# Patient Record
Sex: Male | Born: 1960
Health system: Southern US, Community
[De-identification: ages and names within clinical notes are randomized; demographics above are authoritative.]

## PROBLEM LIST (undated history)

## (undated) ENCOUNTER — Emergency Department: Admission: RE | Disposition: A | Discharge status: Home / Self Care

## (undated) DIAGNOSIS — I2510 Atherosclerotic heart disease of native coronary artery without angina pectoris: Secondary | ICD-10-CM

## (undated) DIAGNOSIS — E785 Hyperlipidemia, unspecified: Secondary | ICD-10-CM

## (undated) DIAGNOSIS — R718 Other abnormality of red blood cells: Secondary | ICD-10-CM

## (undated) DIAGNOSIS — I214 Non-ST elevation (NSTEMI) myocardial infarction: Secondary | ICD-10-CM

## (undated) DIAGNOSIS — D560 Alpha thalassemia: Secondary | ICD-10-CM

## (undated) DIAGNOSIS — K4090 Unilateral inguinal hernia, without obstruction or gangrene, not specified as recurrent: Secondary | ICD-10-CM

## (undated) DIAGNOSIS — T7840XA Allergy, unspecified, initial encounter: Secondary | ICD-10-CM

## (undated) DIAGNOSIS — K219 Gastro-esophageal reflux disease without esophagitis: Secondary | ICD-10-CM

## (undated) DIAGNOSIS — F419 Anxiety disorder, unspecified: Secondary | ICD-10-CM

## (undated) HISTORY — PX: KNEE ARTHROSCOPY: SUR90

## (undated) HISTORY — DX: Atherosclerotic heart disease of native coronary artery without angina pectoris: I25.10

## (undated) HISTORY — PX: OTHER SURGICAL HISTORY: SHX169

## (undated) HISTORY — DX: Hyperlipidemia, unspecified: E78.5

## (undated) HISTORY — DX: Non-ST elevation (NSTEMI) myocardial infarction: I21.4

## (undated) HISTORY — DX: Anxiety disorder, unspecified: F41.9

## (undated) HISTORY — DX: Allergy, unspecified, initial encounter: T78.40XA

## (undated) HISTORY — DX: Gastro-esophageal reflux disease without esophagitis: K21.9

---

## 2006-05-28 ENCOUNTER — Ambulatory Visit: Payer: Self-pay | Admitting: Gastroenterology

## 2008-06-20 DIAGNOSIS — I214 Non-ST elevation (NSTEMI) myocardial infarction: Secondary | ICD-10-CM | POA: Insufficient documentation

## 2008-06-20 HISTORY — DX: Non-ST elevation (NSTEMI) myocardial infarction: I21.4

## 2008-06-27 ENCOUNTER — Inpatient Hospital Stay (HOSPITAL_COMMUNITY): Admission: EM | Admit: 2008-06-27 | Discharge: 2008-06-29 | Payer: Self-pay | Admitting: Emergency Medicine

## 2008-06-28 HISTORY — PX: CORONARY STENT PLACEMENT: SHX1402

## 2008-06-29 ENCOUNTER — Other Ambulatory Visit: Payer: Self-pay | Admitting: *Deleted

## 2008-07-07 ENCOUNTER — Encounter (HOSPITAL_COMMUNITY): Admission: RE | Admit: 2008-07-07 | Discharge: 2008-10-05 | Payer: Self-pay | Admitting: *Deleted

## 2008-10-06 ENCOUNTER — Encounter (HOSPITAL_COMMUNITY): Admission: RE | Admit: 2008-10-06 | Discharge: 2008-12-21 | Payer: Self-pay | Admitting: *Deleted

## 2009-05-08 DIAGNOSIS — E785 Hyperlipidemia, unspecified: Secondary | ICD-10-CM | POA: Insufficient documentation

## 2009-05-08 DIAGNOSIS — K219 Gastro-esophageal reflux disease without esophagitis: Secondary | ICD-10-CM | POA: Insufficient documentation

## 2009-05-15 DIAGNOSIS — Z9861 Coronary angioplasty status: Secondary | ICD-10-CM | POA: Insufficient documentation

## 2009-05-15 DIAGNOSIS — R7301 Impaired fasting glucose: Secondary | ICD-10-CM | POA: Insufficient documentation

## 2009-05-20 DIAGNOSIS — I2510 Atherosclerotic heart disease of native coronary artery without angina pectoris: Secondary | ICD-10-CM

## 2009-05-20 HISTORY — DX: Atherosclerotic heart disease of native coronary artery without angina pectoris: I25.10

## 2009-08-21 DIAGNOSIS — J309 Allergic rhinitis, unspecified: Secondary | ICD-10-CM | POA: Insufficient documentation

## 2009-10-24 HISTORY — PX: CARDIOVASCULAR STRESS TEST: SHX262

## 2010-05-01 ENCOUNTER — Ambulatory Visit: Payer: Self-pay | Admitting: Cardiology

## 2010-05-31 ENCOUNTER — Ambulatory Visit: Payer: Self-pay | Admitting: Cardiology

## 2010-06-28 IMAGING — CR DG CHEST 2V
2 series · 2 of 2 positions shown · non-contrast
Comparison: None

CLINICAL DATA: Left chest pain with left arm numbness.

CHEST - 2 VIEW

[w chest pa]
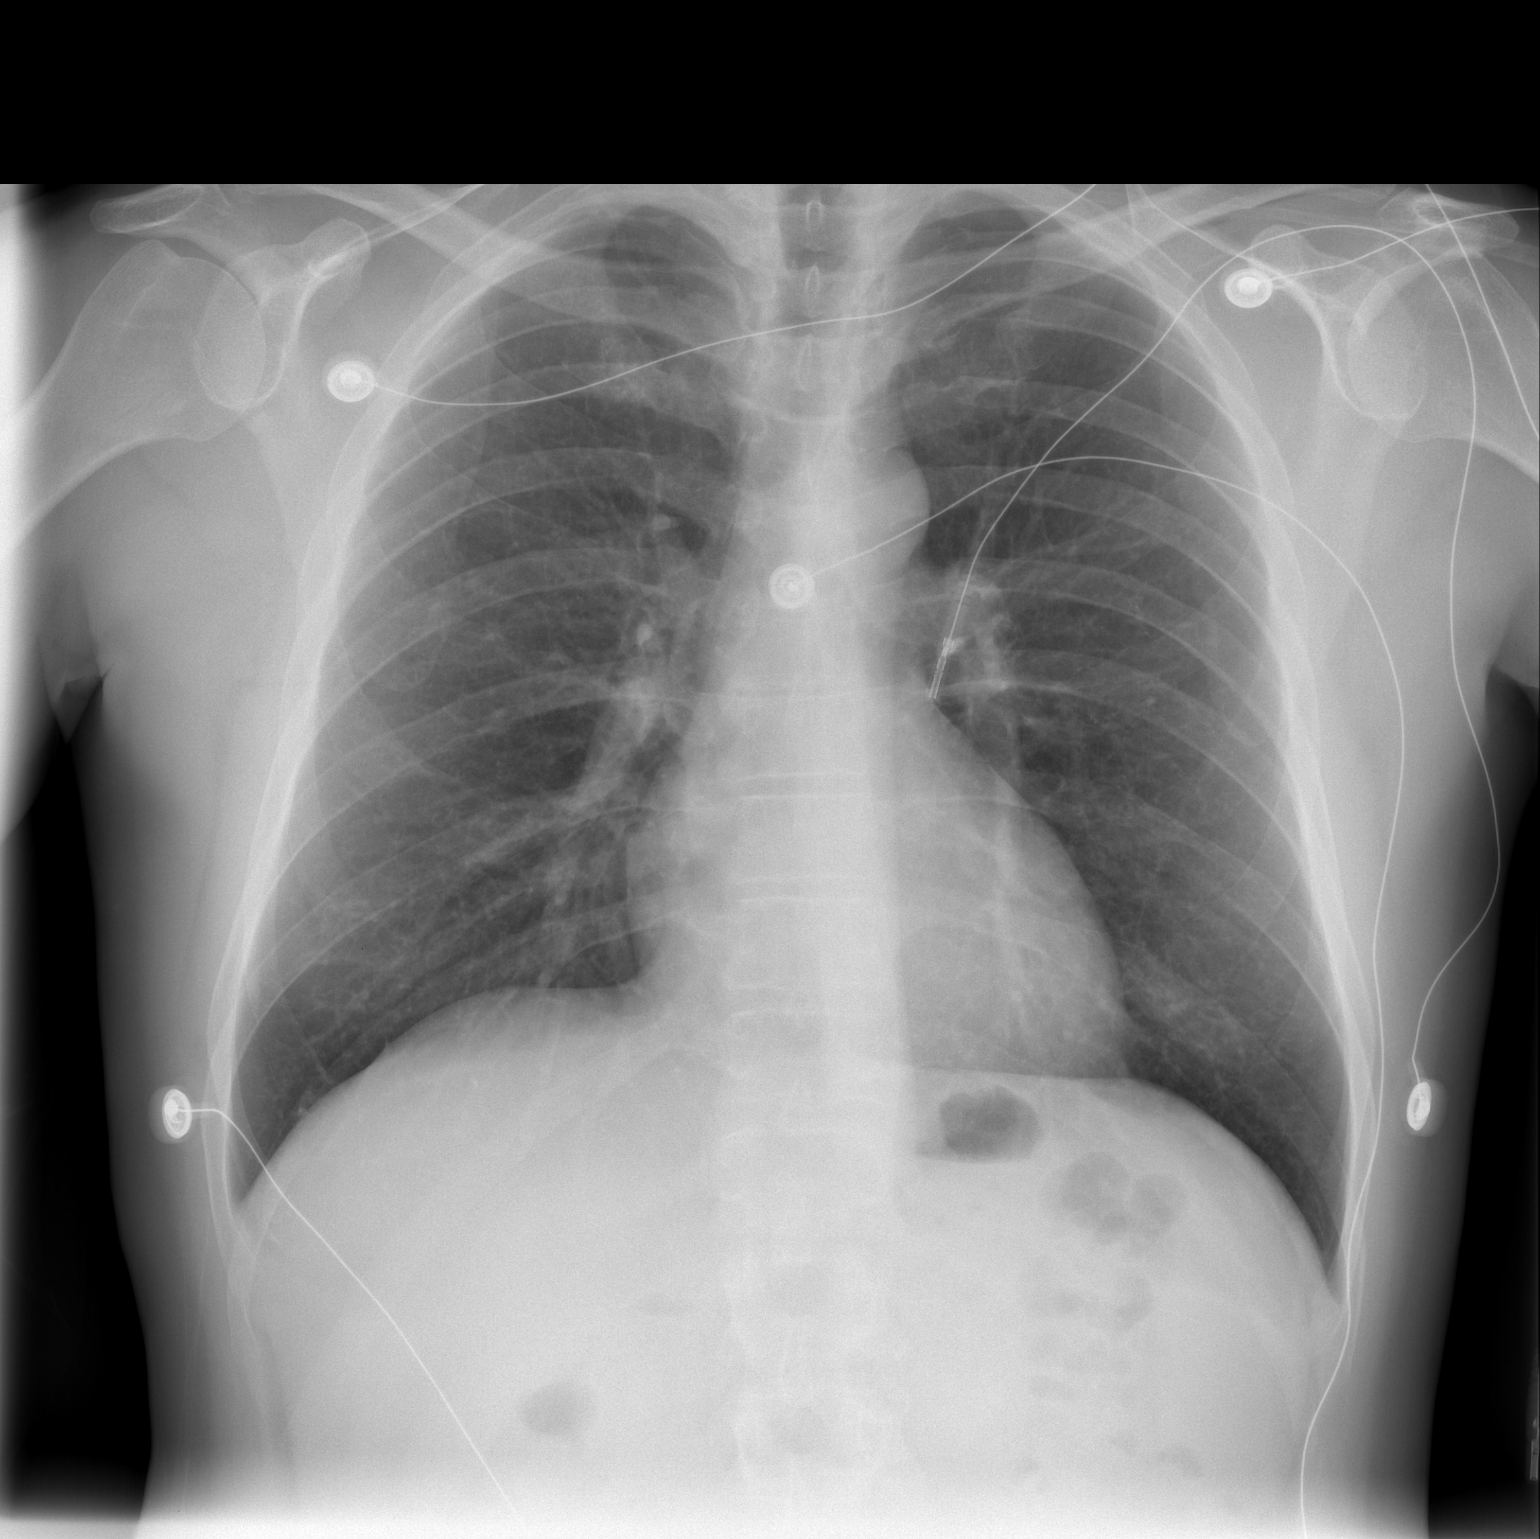

[w chest lat]
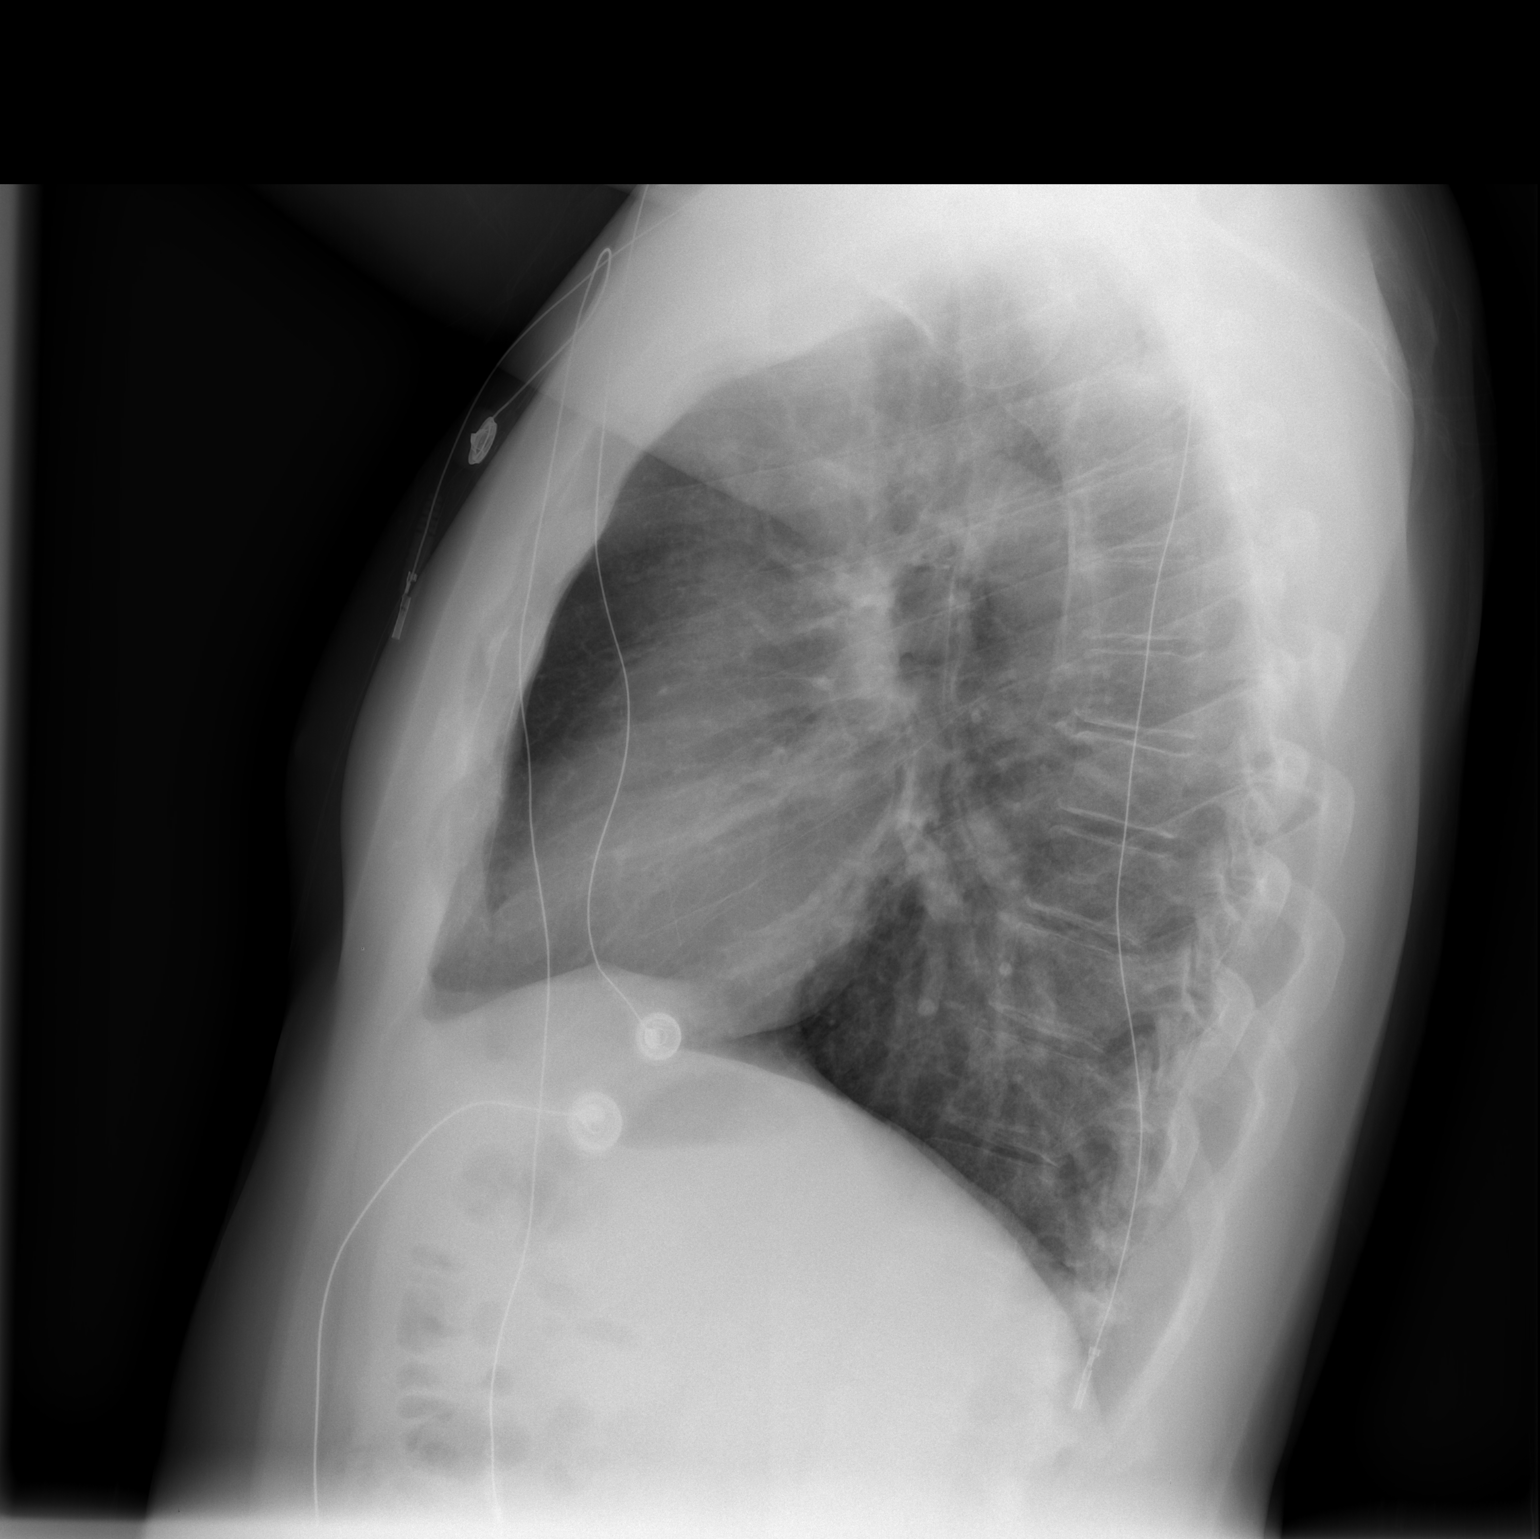

[2 of 2 positions shown; findings below may reference images not displayed]

FINDINGS: The heart size and mediastinal contours are normal.  The
lungs are clear.  There is no pleural effusion or pneumothorax.  No
acute osseous findings are demonstrated.
IMPRESSION: No active cardiopulmonary process.

REF:W2 DICTATED: 06/27/2008 [DATE]

## 2010-09-04 LAB — BASIC METABOLIC PANEL
BUN: 8 mg/dL (ref 6–23)
BUN: 8 mg/dL (ref 6–23)
CO2: 27 mEq/L (ref 19–32)
CO2: 28 mEq/L (ref 19–32)
CO2: 29 mEq/L (ref 19–32)
Calcium: 8.8 mg/dL (ref 8.4–10.5)
Calcium: 8.9 mg/dL (ref 8.4–10.5)
Chloride: 101 mEq/L (ref 96–112)
Chloride: 107 mEq/L (ref 96–112)
Chloride: 108 mEq/L (ref 96–112)
Creatinine, Ser: 0.69 mg/dL (ref 0.4–1.5)
Creatinine, Ser: 0.7 mg/dL (ref 0.4–1.5)
GFR calc Af Amer: >60 mL/min (ref >60)
GFR calc Af Amer: >60 mL/min (ref >60)
GFR calc Af Amer: >60 mL/min (ref >60)
GFR calc non Af Amer: >60 mL/min (ref >60)
GFR calc non Af Amer: >60 mL/min (ref >60)
Glucose, Bld: 107 mg/dL — ABNORMAL HIGH (ref 70–99)
Glucose, Bld: 116 mg/dL — ABNORMAL HIGH (ref 70–99)
Potassium: 3.5 mEq/L (ref 3.5–5.1)
Potassium: 3.5 mEq/L (ref 3.5–5.1)
Potassium: 3.8 mEq/L (ref 3.5–5.1)
Sodium: 135 mEq/L (ref 135–145)
Sodium: 140 mEq/L (ref 135–145)
Sodium: 142 mEq/L (ref 135–145)

## 2010-09-04 LAB — COMPREHENSIVE METABOLIC PANEL
ALT: 18 U/L (ref 0–53)
Albumin: 3.9 g/dL (ref 3.5–5.2)
Alkaline Phosphatase: 48 U/L (ref 39–117)
Potassium: 3.4 mEq/L — ABNORMAL LOW (ref 3.5–5.1)
Sodium: 140 mEq/L (ref 135–145)
Total Protein: 6.9 g/dL (ref 6.0–8.3)

## 2010-09-04 LAB — CBC
HCT: 38.3 % — ABNORMAL LOW (ref 39.0–52.0)
Hemoglobin: 11.7 g/dL — ABNORMAL LOW (ref 13.0–17.0)
Hemoglobin: 12.5 g/dL — ABNORMAL LOW (ref 13.0–17.0)
Hemoglobin: 12.6 g/dL — ABNORMAL LOW (ref 13.0–17.0)
MCHC: 32.6 g/dL (ref 30.0–36.0)
MCV: 68.4 fL — ABNORMAL LOW (ref 78.0–100.0)
Platelets: 155 10*3/uL (ref 150–400)
RBC: 5.29 MIL/uL (ref 4.22–5.81)
RBC: 5.6 MIL/uL (ref 4.22–5.81)
RBC: 5.82 MIL/uL — ABNORMAL HIGH (ref 4.22–5.81)
RDW: 13.7 % (ref 11.5–15.5)
RDW: 13.8 % (ref 11.5–15.5)
WBC: 6.7 10*3/uL (ref 4.0–10.5)

## 2010-09-04 LAB — URINALYSIS, ROUTINE W REFLEX MICROSCOPIC
Bilirubin Urine: NEGATIVE
Hgb urine dipstick: NEGATIVE
Ketones, ur: NEGATIVE mg/dL
Protein, ur: NEGATIVE mg/dL
Urobilinogen, UA: 0.2 mg/dL (ref 0.0–1.0)

## 2010-09-04 LAB — POCT I-STAT, CHEM 8
BUN: 16 mg/dL (ref 6–23)
Calcium, Ion: 1.17 mmol/L (ref 1.12–1.32)
Chloride: 101 mEq/L (ref 96–112)
Creatinine, Ser: 0.9 mg/dL (ref 0.4–1.5)
Glucose, Bld: 106 mg/dL — ABNORMAL HIGH (ref 70–99)
HCT: 43 % (ref 39.0–52.0)
Hemoglobin: 14.6 g/dL (ref 13.0–17.0)
Potassium: 3.5 mEq/L (ref 3.5–5.1)
Sodium: 142 mEq/L (ref 135–145)
TCO2: 30 mmol/L (ref 0–100)

## 2010-09-04 LAB — CARDIAC PANEL(CRET KIN+CKTOT+MB+TROPI)
CK, MB: 11.2 ng/mL — ABNORMAL HIGH (ref 0.3–4.0)
Relative Index: 8.8 — ABNORMAL HIGH (ref 0.0–2.5)
Total CK: 128 U/L (ref 7–232)
Troponin I: 1.69 ng/mL (ref 0.00–0.06)

## 2010-09-04 LAB — DIFFERENTIAL
Basophils Absolute: 0 10*3/uL (ref 0.0–0.1)
Basophils Relative: 0 % (ref 0–1)
Eosinophils Absolute: 0.1 10*3/uL (ref 0.0–0.7)
Eosinophils Relative: 2 % (ref 0–5)
Lymphocytes Relative: 29 % (ref 12–46)
Lymphs Abs: 1.5 10*3/uL (ref 0.7–4.0)
Monocytes Absolute: 0.4 10*3/uL (ref 0.1–1.0)
Monocytes Relative: 7 % (ref 3–12)
Neutro Abs: 3.3 10*3/uL (ref 1.7–7.7)
Neutrophils Relative %: 62 % (ref 43–77)

## 2010-09-04 LAB — LIPID PANEL
Cholesterol: 167 mg/dL (ref 0–200)
LDL Cholesterol: 124 mg/dL — ABNORMAL HIGH (ref 0–99)
Total CHOL/HDL Ratio: 7 RATIO
Triglycerides: 95 mg/dL (ref <150)
VLDL: 19 mg/dL (ref 0–40)

## 2010-09-04 LAB — POCT CARDIAC MARKERS
CKMB, poc: 1.1 ng/mL (ref 1.0–8.0)
Myoglobin, poc: 98.8 ng/mL (ref 12–200)
Troponin i, poc: 0.22 ng/mL — ABNORMAL HIGH (ref 0.00–0.09)

## 2010-09-04 LAB — CK TOTAL AND CKMB (NOT AT ARMC)
CK, MB: 2.2 ng/mL (ref 0.3–4.0)
Relative Index: 2.2 (ref 0.0–2.5)

## 2010-09-04 LAB — PROTIME-INR
INR: 1 (ref 0.00–1.49)
Prothrombin Time: 13.7 seconds (ref 11.6–15.2)

## 2010-09-04 LAB — TROPONIN I: Troponin I: 1.23 ng/mL (ref 0.00–0.06)

## 2010-09-04 LAB — APTT: aPTT: 28 seconds (ref 24–37)

## 2010-09-04 LAB — GLUCOSE, CAPILLARY: Glucose-Capillary: 105 mg/dL — ABNORMAL HIGH (ref 70–99)

## 2010-10-02 NOTE — H&P (Signed)
NAMEABOUBACAR, [name]             ACCOUNT NO.:  [id number]   MEDICAL RECORD NO.:  [id number]          PATIENT TYPE:  INP   LOCATION:  0110                         FACILITY:  [facility]   PHYSICIAN:  Elmore Guise., M.D.DATE OF BIRTH:  [birth year]   DATE OF ADMISSION:  06/27/2008  DATE OF DISCHARGE:                              HISTORY & PHYSICAL   PRIMARY CARE PHYSICIAN:  Dr. Eric Form   REASON FOR ADMISSION:  Acute coronary syndrome.   HISTORY OF PRESENT ILLNESS:  [name] is a very pleasant 50 year old  male with past medical history of gastroesophageal reflux disease who  presents for evaluation of chest pain.  The patient reports his symptoms  worsening since last Friday.  Prior to that time, he would have off-and-  on symptoms.  He actually underwent exercise stress testing back in  December 2009 which was normal.  After his stress test and in January  2010, he had no problems.  However, since last Friday, he has been  having off-and-on symptoms of substernal chest pressure.  He states his  symptoms are worse with exertion and are associated with nausea,  diaphoresis.  He had a spell on Friday that lasted approximately an  hour.  However, once it resolved, he was then able to get up and do his  normal activities.  He stated that he also noted some increased  belching at that time.  He did not think much of it.  On Saturday and  Sunday, he was able to go and do his normal activities without any  problems.  Early this morning, he started having an episode where he  described severe substernal chest pressure with radiation to both arms  associated with diaphoresis and nausea.  This did not get much better  with rest.  Because of his symptoms, came to the emergency room for  evaluation.  Here, his ECG was normal.  However, his initial troponin  was elevated.  He currently remains chest pain free and will be admitted  for evaluation and treatment.  He denies any recent fever or  cough.  No  orthopnea or PND.  No melena.   REVIEW OF SYSTEMS:  As per HPI.  All others are negative.   CURRENT MEDICATION:  Nexium.   ALLERGIES:  None.   FAMILY HISTORY:  Positive for heart disease and stroke, father having  bypass in his 27s, mother having a heart attack in her 48s.   SOCIAL HISTORY:  He is married.  No tobacco or alcohol use.   PHYSICAL EXAMINATION:  VITAL SIGNS:  He is afebrile.  Blood pressure is  100/60, heart rate 80, saturation 95% on room air.  IN GENERAL:  He is a very pleasant middle-aged male alert and oriented  x4 in no acute distress.  HEENT:  Unremarkable.  NECK:  Supple.  No lymphadenopathy, 2+ carotids.  No JVD and no bruits.  Thyroid is normal.  LUNGS:  Clear.  HEART:  Regular with normal S1, S2.  No rub noted.  ABDOMEN:  Soft, nontender, nondistended.  No rebound or guarding.  No  abdominal bruits  noted.  EXTREMITIES:  Warm with 2+ pulses and no edema.  Femorals are 2+ and  equal bilaterally.  NEUROLOGICALLY:  He has no focal deficits.   LABORATORY DATA:  His blood work shows a hemoglobin of 12.6.  White  blood cell count 5.3, platelet count 156.  Coags are normal with a  PT/INR of 13.7 and 1 respectively and PTT of 28.  BUN and creatinine of  14 and 0.8 respectively with potassium level of 3.4.  Total bilirubin  was 1.6 with normal alkaline phosphatase, AST and ALT.  CPK was 101 and  107 with an MB of 2.2 and then 7.3.  Troponin-I was 0.42 and 1.23.  His  chest x-ray shows no acute cardiopulmonary disease.  ECG shows normal  sinus rhythm at 80 per minute with no significant ST or T-wave changes.   IMPRESSION:  1. Acute coronary syndrome.  2. History of gastroesophageal reflux disease.   PLAN:  1. The patient will be admitted to telemetry monitoring.  We will      start him on nitroglycerin drip.  Continue Lovenox 1 mg/kg      subcutaneously twice daily.  He will be started on metoprolol 25 mg      twice daily as well as Crestor 20 mg  daily.  At this time, I will      hold Plavix until we can finish his heart catheterization.  He will      be scheduled for heart catheterization in the morning.  He will      have serial cardiac markers done as well as serial ECGs as needed.      All this was explained with him at length.      Elmore Guise., M.D.  Electronically Signed     TWK/MEDQ  D:  06/27/2008  T:  06/27/2008  Job:  [id number]

## 2010-10-02 NOTE — Cardiovascular Report (Signed)
NAMEMARCELL, CHAVARIN NO.:  [id number]   MEDICAL RECORD NO.:  [id number]          PATIENT TYPE:  INP   LOCATION:  2502                         FACILITY:  [facility]   PHYSICIAN:  Peter M. Swaziland, M.D.  DATE OF BIRTH:  09/16/1960   DATE OF PROCEDURE:  DATE OF DISCHARGE:                            CARDIAC CATHETERIZATION   INDICATIONS FOR PROCEDURE:  A 50 year old male who presented with a non-  Q-wave myocardial infarction.  He has a history of hypercholesterolemia.  Diagnostic cardiac catheterization performed by Dr. Reyes Ivan demonstrated  a 90% proximal LAD stenosis with thrombus, which was the culprit lesion.  The left circumflex coronary artery had 50% disease, and the right  coronary artery had 70% disease in the proximal to mid vessel.  We  recommended intervention on the culprit lesion.  The patient did have a  normal stress test, well over a month ago, and therefore, we will not  intervene on the right coronary at this time unless he were to have  recurrent angina.   The access was via the right femoral artery using a 6-French arterial  sheath.   EQUIPMENTS:  1. A 6-French FL4 guide.  2. A 0.014 Prowater wire.  3. A Fetch extraction catheter.  4. A 2.5 x 12 mm Apex balloon.  5. A 3.0 x 15 mm XIENCE stent.  6. A 3.25 x 12 mm [place] Voyager balloon.   MEDICATIONS:  1. Nitroglycerin 200 mcg intracoronary x1.  2. Angiomax bolus at 0.75 mg/kg followed by continuous infusion at      1.75 mg/kg/hour.  Subsequent ACT was 286 second.  3. Effient 60 mg p.o.   The lesion in the proximal LAD was crossed easily with the guidewire.  We initially performed a couple of passes with the Fetch extraction  catheter and were able to obtain a moderate amount of white thrombus.  Angiographically, the vessel was improved.  We next predilated the  lesion using a 2.5 x 12 mm Apex stent up to 6 atmospheres.  We then  deployed the 3.0 x 15 mm XIENCE stent at 9 and then 12 atmospheres  with  a stent balloon.  It was postdilated using a 3.25 x 12 mm [place] Voyager  balloon up to 14 atmospheres.  This yielded an excellent angiographic  result with 0% residual stenosis and TIMI grade 3 flow.  There was no  residual thrombus noted.  The patient was pain free and hemodynamically  stable.   FINAL INTERPRETATION:  Successful intracoronary stenting of the proximal  left anterior descending with thrombus extraction.           ______________________________  Peter M. [place], M.D.     PMJ/MEDQ  D:  06/28/2008  T:  06/28/2008  Job:  [id number]   cc:   Elmore Guise., M.D.  Kari Baars, M.D.

## 2010-10-02 NOTE — Discharge Summary (Signed)
NAMEJOSIYAH, Bob Adams             ACCOUNT NO.:  [id number]   MEDICAL RECORD NO.:  [id number]          PATIENT TYPE:  INP   LOCATION:  2502                         FACILITY:  [facility]   PHYSICIAN:  Elmore Guise., M.D.DATE OF BIRTH:  [birth year]   DATE OF ADMISSION:  06/28/2008  DATE OF DISCHARGE:  06/29/2008                               DISCHARGE SUMMARY   DISCHARGE DIAGNOSES:  1. Non-ST-elevation myocardial infarction (peak troponin 1.69).  2. Coronary artery disease (status post percutaneous coronary      intervention and drug-eluting stent placement to proximal left      anterior descending).  3. Mild plaque formation in the mid circumflex (approximately 30-40%      stenosis).  4. Moderate disease in the mid right coronary artery (60-70%      stenosis).  5. History of gastroesophageal reflux disease.  6. Dyslipidemia.   HISTORY OF PRESENT ILLNESS:  [name] is a very pleasant 50 year old  white male who presented with acute coronary syndrome.  He was admitted  to the hospital for further treatment.   HOSPITAL COURSE:  The patient's hospital course was uncomplicated.  He  had a peak troponin of 1.69.  He underwent cardiac catheterization on  June 28, 2008.  He tolerated the procedure well.  His cath did show  90-95% stenosis of the proximal to/mid LAD with an ulcerated plaque and  thrombus formation noted.  He had 30-40% stenosis noted in the mid  circumflex vessel and RCA was dominant with mid 60-70% stenosis noted.  He underwent elective PCI of his LAD with excellent results.  Following  his procedure, he had no problems.  He has now been up and ambulatory  and tolerating a normal diet well.  He has had no further chest pain.  His lab work on discharge showed a BUN and creatinine of 8 and 0.7, a  potassium level of 3.5.  His hemoglobin was 12.5 with a platelet count  of 155.  His lipid profile had a total cholesterol of 167, triglycerides  of 95, HDL of 24 and LDL  of 124.  The patient will be discharged home  today to continue the following medications.  1. Effient 10 mg daily.  2. Aspirin 325 mg daily.  3. Nexium 40 mg daily.  4. Crestor 20 mg daily.  5. Toprol 25 mg daily.  6. Nitroglycerin on a p.r.n. basis.   He should resume post cath restrictions.  He is to slowly increase his  activity.  He was advised not to return to work until July 06, 2008.  He is not to do any heavy lifting or strenuous activity until that time.  I would like to see him back in the office in 1 week.  Should he have  any further anginal symptoms, he will be scheduled for stage procedure  of his mid RCA.  I do think at this time aggressive medical therapy is  recommended unless he becomes  refractory.  He needs to be treated with dual antiplatelet agents for at  least 1 year.  Effient was recommended because of his use of  Nexium in  the past.  Unless he has any further problems, I plan to see him in the  office in 1 week.      Elmore Guise., M.D.  Electronically Signed     TWK/MEDQ  D:  06/29/2008  T:  06/29/2008  Job:  [id number]   cc:   Kari Baars, M.D.

## 2010-10-02 NOTE — Cardiovascular Report (Signed)
NAMEJOVANIE, VERGE             ACCOUNT NO.:  [id number]   MEDICAL RECORD NO.:  [id number]          PATIENT TYPE:  INP   LOCATION:  2502                         FACILITY:  [facility]   PHYSICIAN:  Elmore Guise., M.D.DATE OF BIRTH:  Jul 02, 1960   DATE OF PROCEDURE:  06/28/2008  DATE OF DISCHARGE:                            CARDIAC CATHETERIZATION   INDICATIONS FOR PROCEDURE:  Acute coronary syndrome.   HISTORY OF PRESENT ILLNESS:  Mr. Broxson is a very pleasant 50 year old  male with past medical history of gastroesophageal reflux disease, who  presented with increasing chest pain and shortness of breath starting  this past Friday.  His ECG was normal.  However, his troponins were  abnormal.  He is now referred for cardiac catheterization.   DESCRIPTION OF PROCEDURE:  After appropriate informed consent, the  patient was prepped and draped in a sterile fashion.  Approximately 10  mL of 1% lidocaine was used for local anesthesia.  A 5-French sheath was  placed in the right femoral artery without difficulty.  Coronary  angiography, LV angiography were then performed.  Sheath was left in  place to continue on with elective intervention.   FINDINGS:  1. Left Main:  Normal.  2. LAD:  Moderate-sized vessel with prox to mid ulcerated plaque and      thrombus formation, 90% stenosis noted.  There is mild to moderate      mid and distal luminal irregularities noted.  3. D1/D2:  Small vessels with mild luminal irregularities.  4. d3:  Moderate-sized vessel with mild luminal irregularities.  5. LCX:  Nondominant with mid 30-40% stenosis, mild distal luminal      irregularities were noted.  6. OM-1/OM-2:  Mild luminal irregularities.  7. RCA:  Dominant with mid 70% stenosis, mild to moderate distal      luminal irregularities noted.  8. LV:  EF 65%.  No wall motion abnormalities.  LVEDP is 13 mmHg.   IMPRESSION:  1. Ulcerated plaque with thrombus formation and 90% stenosis in the  proximal to mid left anterior descending .  2. Mild left circumflex disease (30-40% mid stenosis).  3. Moderate right coronary artery disease with 70% mid stenosis.  4. Vigorous left ventricular systolic function, ejection fraction 65%.   PLAN:  At this time, I would recommend elective PCI of the LAD.  Would  continue aggressive antiplatelet therapies with aspirin, Effient, low-  dose beta-blocker as well as statin therapy.  If symptoms continue then  staged procedure of RCA will be scheduled.      Elmore Guise., M.D.  Electronically Signed     TWK/MEDQ  D:  06/28/2008  T:  06/28/2008  Job:  [id number]   cc:   Kari Baars, M.D.

## 2010-10-05 NOTE — Assessment & Plan Note (Signed)
[facility]                         GASTROENTEROLOGY OFFICE NOTE   MYKEL, SPONAUGLE                      MRN:          [id number]  DATE:05/28/2006                            DOB:          1961/05/17    REASON FOR CONSULTATION:  Reflux.   Bob Adams is a pleasant, 50 year old male referred through the  courtesy of Dr. Clelia Croft for evaluation.  For years, he has suffered from  reflux symptoms consisting of pyrosis.  He has been on various PPIs and  H-2 receptor antagonists with good control.  With dietary indiscretion,  he may have breakthrough pyrosis.  He currently takes Nexium which he  feels is the best medicine for him.  He denies dysphagia, cough, or sore  throat.  He occasionally has some very nonspecific upper abdominal  discomfort.  He has undergone upper endoscopy twice in the last 7 years  for this problem.  Hiatal hernia was diagnosed.   PAST MEDICAL HISTORY:  Unremarkable.   FAMILY HISTORY:  Noncontributory.   MEDICATIONS:  Include Nexium.   He has no allergies.   He neither smokes nor drinks.  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   REVIEW OF SYSTEMS:  Pertinent for insomnia.   EXAM:  He is a rather anxious male.  Pulse 72.  Blood pressure 120/70.  Weight 164.  HEENT:  EOMI. PERRLA. Sclerae are anicteric.  Conjunctivae are pink.  NECK:  Supple without thyromegaly, adenopathy or carotid bruits.  CHEST:  Clear to auscultation and percussion without adventitious  sounds.  CARDIAC:  Regular rhythm; normal S1 S2.  There are no murmurs, gallops  or rubs.  ABDOMEN:  Bowel sounds are normoactive.  Abdomen is soft, non-tender and  non-distended.  There are no abdominal masses, tenderness, splenic  enlargement or hepatomegaly.  EXTREMITIES:  Full range of motion.  No cyanosis, clubbing or edema.  RECTAL:  Deferred.   IMPRESSION:  1. Gastroesophageal reflux disease - well controlled with proton-pump      inhibitor therapy.  2. Anxiety.   This seems to be a chronic problem.  This was discussed      in some detail and I suggested that he may consider an anxiolytic.      He will pursue this with Dr. Clelia Croft.   RECOMMENDATION:  1. Continue Nexium.  2. Consider anxiolytic medicines.  3. Screening colonoscopy in approximately 4 years.     Barbette Hair. Arlyce Dice, MD,FACG  Electronically Signed    RDK/MedQ  DD: 05/28/2006  DT: 05/28/2006  Job #: [id number]   cc:   Kari Baars, M.D.

## 2010-10-22 ENCOUNTER — Other Ambulatory Visit: Payer: Self-pay | Admitting: Cardiology

## 2010-10-22 MED ORDER — ROSUVASTATIN CALCIUM 40 MG PO TABS: 40.0000 mg | ORAL_TABLET | Freq: Every day | ORAL | Status: DC

## 2010-10-22 MED ORDER — PRASUGREL HCL 10 MG PO TABS: 10.0000 mg | ORAL_TABLET | Freq: Every day | ORAL | Status: DC

## 2010-10-22 NOTE — Telephone Encounter (Signed)
escribe medication per fax request  

## 2010-10-22 NOTE — Telephone Encounter (Signed)
NEEDS EFFIN AND CRESTCOR CALLED INTO [facility]  [phone]

## 2010-11-16 ENCOUNTER — Encounter: Payer: Self-pay | Admitting: Cardiology

## 2010-11-26 ENCOUNTER — Encounter: Payer: Self-pay | Admitting: Cardiology

## 2010-11-26 ENCOUNTER — Ambulatory Visit (INDEPENDENT_AMBULATORY_CARE_PROVIDER_SITE_OTHER): Payer: 59 | Admitting: Cardiology

## 2010-11-26 DIAGNOSIS — I2510 Atherosclerotic heart disease of native coronary artery without angina pectoris: Secondary | ICD-10-CM

## 2010-11-26 DIAGNOSIS — I214 Non-ST elevation (NSTEMI) myocardial infarction: Secondary | ICD-10-CM

## 2010-11-26 DIAGNOSIS — E785 Hyperlipidemia, unspecified: Secondary | ICD-10-CM

## 2010-11-26 NOTE — Patient Instructions (Signed)
Keep up the good work with your diet and exercise.  I will see you back again in 6 months.

## 2010-11-27 DIAGNOSIS — I2510 Atherosclerotic heart disease of native coronary artery without angina pectoris: Secondary | ICD-10-CM | POA: Insufficient documentation

## 2010-11-27 DIAGNOSIS — E785 Hyperlipidemia, unspecified: Secondary | ICD-10-CM | POA: Insufficient documentation

## 2010-11-27 NOTE — Assessment & Plan Note (Signed)
His lipid panel from December of 2011 was reviewed and was excellent. Continue with his current therapy.

## 2010-11-27 NOTE — Progress Notes (Signed)
   Bob Adams Date of Birth: [birth year]   History of Present Illness: Bob Adams is seen today for followup. He reports that he has been doing very well. He denies any symptoms of chest pain, shoulder pain, or shortness of breath. He remains very active. He is watching his diet closely and has lost 7 pounds.  Current Outpatient Prescriptions on File Prior to Visit  Medication Sig Dispense Refill  . aspirin 81 MG tablet Take 81 mg by mouth daily.        . nitroGLYCERIN (NITROSTAT) 0.4 MG SL tablet Place 0.4 mg under the tongue every 5 (five) minutes as needed.        . prasugrel (EFFIENT) 10 MG TABS Take 1 tablet (10 mg total) by mouth daily.  90 tablet  3  . rosuvastatin (CRESTOR) 40 MG tablet Take 1 tablet (40 mg total) by mouth daily.  90 tablet  3    No Known Allergies  Past Medical History  Diagnosis Date  . Coronary artery disease   . Non Q wave myocardial infarction 06/2008  . Dyslipidemia   . GERD (gastroesophageal reflux disease)     Past Surgical History  Procedure Date  . Coronary stent placement 06/28/2008    SUCCESSFUL INTRACORONARY STENTING OF THE PROXIMAL LEFT ANTERIOR DESCENDING WITH THROMBUS EXTRACTION. EF 65%  . Cardiovascular stress test 10/24/2009    EF 60%    History  Smoking status  . Never Smoker   Smokeless tobacco  . Not on file    History  Alcohol Use No    Family History  Problem Relation Age of Onset  . Heart attack Mother     Review of Systems:  All other systems were reviewed and are negative.  Physical Exam: BP 120/70  Pulse 80  Ht 5\' 7"  (1.702 m)  Wt 155 lb (70.308 kg)  BMI 24.28 kg/m2 He is a thin male in no acute distress. His HEENT exam is unremarkable. He has no JVD or bruits. Lungs are clear. Cardiac exam reveals a regular rate and rhythm without gallop, murmur, or click. Abdomen is soft and nontender. He has no masses or bruits. He has no edema. Pedal pulses are good. He is alert and oriented x3. Cranial nerves II through  XII are intact. LABORATORY DATA:   Assessment / Plan:

## 2010-11-27 NOTE — Assessment & Plan Note (Signed)
He is asymptomatic. He had a normal stress echo evaluation in June 2011. We will continue with aspirin and Effient and  statin therapy. I will followup again in 6 months.

## 2011-05-20 ENCOUNTER — Encounter: Payer: Self-pay | Admitting: Cardiology

## 2011-06-04 ENCOUNTER — Ambulatory Visit (INDEPENDENT_AMBULATORY_CARE_PROVIDER_SITE_OTHER): Payer: 59 | Admitting: Cardiology

## 2011-06-04 ENCOUNTER — Encounter: Payer: Self-pay | Admitting: Cardiology

## 2011-06-04 VITALS — BP 110/70 | HR 76 | Ht 66.0 in | Wt 162.6 lb

## 2011-06-04 DIAGNOSIS — E785 Hyperlipidemia, unspecified: Secondary | ICD-10-CM

## 2011-06-04 DIAGNOSIS — I2510 Atherosclerotic heart disease of native coronary artery without angina pectoris: Secondary | ICD-10-CM

## 2011-06-04 NOTE — Patient Instructions (Signed)
Continue your current medications and exercise.  I will see you again in 6 months.   

## 2011-06-04 NOTE — Assessment & Plan Note (Signed)
Excellent lipid control on current medications.

## 2011-06-04 NOTE — Assessment & Plan Note (Signed)
He remains asymptomatic. He is on appropriate risk factor modification. Will plan a followup again in 6 months. We will consider a stress test at that time since his last evaluation was in June of 2011.

## 2011-06-04 NOTE — Progress Notes (Signed)
   Bob Adams Date of Birth: Apr 09, 1961   History of Present Illness: Bob Adams is seen today for followup. He reports that he has been doing very well. He denies any symptoms of chest pain, shoulder pain, or shortness of breath. He remains very active. He did make a visit to [place] over the holidays and had no difficulty with his extended travel.  Current Outpatient Prescriptions on File Prior to Visit  Medication Sig Dispense Refill  . aspirin 81 MG tablet Take 81 mg by mouth daily.        . nitroGLYCERIN (NITROSTAT) 0.4 MG SL tablet Place 0.4 mg under the tongue every 5 (five) minutes as needed.        . prasugrel (EFFIENT) 10 MG TABS Take 1 tablet (10 mg total) by mouth daily.  90 tablet  3  . rosuvastatin (CRESTOR) 40 MG tablet Take 1 tablet (40 mg total) by mouth daily.  90 tablet  3    No Known Allergies  Past Medical History  Diagnosis Date  . Coronary artery disease   . Non Q wave myocardial infarction 06/2008  . Dyslipidemia   . GERD (gastroesophageal reflux disease)     Past Surgical History  Procedure Date  . Coronary stent placement 06/28/2008    SUCCESSFUL INTRACORONARY STENTING OF THE PROXIMAL LEFT ANTERIOR DESCENDING WITH THROMBUS EXTRACTION. EF 65%  . Cardiovascular stress test 10/24/2009    EF 60%    History  Smoking status  . Never Smoker   Smokeless tobacco  . Not on file    History  Alcohol Use No    Family History  Problem Relation Age of Onset  . Heart attack Mother     Review of Systems:  All other systems were reviewed and are negative.  Physical Exam: BP 110/70  Pulse 76  Ht 5\' 6"  (1.676 m)  Wt 162 lb 9.6 oz (73.755 kg)  BMI 26.24 kg/m2 He is a thin male in no acute distress. His HEENT exam is unremarkable. He has no JVD or bruits. Lungs are clear. Cardiac exam reveals a regular rate and rhythm without gallop, murmur, or click. Abdomen is soft and nontender. He has no masses or bruits. He has no edema. Pedal pulses are good. He is alert  and oriented x3. Cranial nerves II through XII are intact. LABORATORY DATA: Dated 05/20/2011 his chemistry panel was normal with the exception of total bilirubin of 3.1. CBC showed mild anemia with a hemoglobin 11.7. Total cholesterol 118, triglycerides 68, HDL 42, LDL 62. Urinalysis was negative. Further anemia workup including LDH, haptoglobin, and iron studies were normal. ECG today is normal.  Assessment / Plan:

## 2011-09-03 DIAGNOSIS — J45909 Unspecified asthma, uncomplicated: Secondary | ICD-10-CM | POA: Insufficient documentation

## 2011-11-11 ENCOUNTER — Other Ambulatory Visit: Payer: Self-pay | Admitting: Cardiology

## 2011-11-11 NOTE — Telephone Encounter (Signed)
New msg Pt wants refill effient and crestor Please call to [facility] He wants 90 days supply [phone]

## 2011-11-13 MED ORDER — PRASUGREL HCL 10 MG PO TABS: 10.0000 mg | ORAL_TABLET | Freq: Every day | ORAL | Status: DC

## 2011-11-13 MED ORDER — ROSUVASTATIN CALCIUM 40 MG PO TABS: 40.0000 mg | ORAL_TABLET | Freq: Every day | ORAL | Status: DC

## 2011-11-13 NOTE — Telephone Encounter (Signed)
F/u   Patient calling to f/u as refill is not available through [facility].

## 2011-11-14 ENCOUNTER — Telehealth: Payer: Self-pay

## 2011-11-14 MED ORDER — PRASUGREL HCL 10 MG PO TABS: 10.0000 mg | ORAL_TABLET | Freq: Every day | ORAL | Status: DC

## 2011-11-14 MED ORDER — ROSUVASTATIN CALCIUM 40 MG PO TABS: 40.0000 mg | ORAL_TABLET | Freq: Every day | ORAL | Status: DC

## 2011-11-14 NOTE — Telephone Encounter (Signed)
Patient's wife called was told refills on effient and crestor sent to [facility].

## 2012-02-20 ENCOUNTER — Encounter: Payer: Self-pay | Admitting: *Deleted

## 2012-02-20 ENCOUNTER — Encounter: Payer: Self-pay | Admitting: Cardiology

## 2012-04-29 ENCOUNTER — Ambulatory Visit (INDEPENDENT_AMBULATORY_CARE_PROVIDER_SITE_OTHER): Payer: 59 | Admitting: Cardiology

## 2012-04-29 ENCOUNTER — Encounter: Payer: Self-pay | Admitting: Cardiology

## 2012-04-29 VITALS — BP 129/72 | HR 77 | Ht 66.0 in | Wt 162.1 lb

## 2012-04-29 DIAGNOSIS — I2510 Atherosclerotic heart disease of native coronary artery without angina pectoris: Secondary | ICD-10-CM

## 2012-04-29 DIAGNOSIS — E785 Hyperlipidemia, unspecified: Secondary | ICD-10-CM

## 2012-04-29 DIAGNOSIS — I214 Non-ST elevation (NSTEMI) myocardial infarction: Secondary | ICD-10-CM

## 2012-04-29 NOTE — Progress Notes (Signed)
   Claud Kelp Date of Birth: 01/06/1961   History of Present Illness: Bob Adams is seen today for followup. He reports that he has been doing very well. He denies any symptoms of chest pain, shoulder pain, or shortness of breath. He remains very active. He is concerned about bruising on Effient since he enjoys playing soccer in an adult league.  Current Outpatient Prescriptions on File Prior to Visit  Medication Sig Dispense Refill  . aspirin 81 MG tablet Take 81 mg by mouth daily.        . fluticasone (FLONASE) 50 MCG/ACT nasal spray Place 1 spray into the nose as needed.       . nitroGLYCERIN (NITROSTAT) 0.4 MG SL tablet Place 0.4 mg under the tongue every 5 (five) minutes as needed.        . prasugrel (EFFIENT) 10 MG TABS Take 1 tablet (10 mg total) by mouth daily.  90 tablet  3  . rosuvastatin (CRESTOR) 40 MG tablet Take 1 tablet (40 mg total) by mouth daily.  90 tablet  3    No Known Allergies  Past Medical History  Diagnosis Date  . Coronary artery disease   . Non Q wave myocardial infarction 06/2008  . Dyslipidemia   . GERD (gastroesophageal reflux disease)     Past Surgical History  Procedure Date  . Coronary stent placement 06/28/2008    SUCCESSFUL INTRACORONARY STENTING OF THE PROXIMAL LEFT ANTERIOR DESCENDING WITH THROMBUS EXTRACTION. EF 65%  . Cardiovascular stress test 10/24/2009    EF 60%    History  Smoking status  . Never Smoker   Smokeless tobacco  . Not on file    History  Alcohol Use No    Family History  Problem Relation Age of Onset  . Heart attack Mother     Review of Systems:  All other systems were reviewed and are negative.  Physical Exam: BP 129/72  Pulse 77  Ht 5\' 6"  (1.676 m)  Wt 162 lb 1.9 oz (73.537 kg)  BMI 26.17 kg/m2 He is a thin male in no acute distress. His HEENT exam is unremarkable. He has no JVD or bruits. Lungs are clear. Cardiac exam reveals a regular rate and rhythm without gallop, murmur, or click. Abdomen is soft  and nontender. He has no masses or bruits. He has no edema. Pedal pulses are good. He is alert and oriented x3. Cranial nerves II through XII are intact.  LABORATORY DATA:   Assessment / Plan: 1. Coronary disease status post stenting of the proximal LAD in February 2010 with a 3.0 x 15 mm science stent. I think his risk of late stent thrombosis is very low. I recommended that he may stop his Effient at this point and continue aspirin indefinitely. I will followup again in 6 months  2. Hyperlipidemia on Crestor

## 2012-04-29 NOTE — Patient Instructions (Signed)
You may stop Effient.  I will see you again in 6 months.

## 2012-05-26 ENCOUNTER — Encounter: Payer: Self-pay | Admitting: Internal Medicine

## 2012-07-06 ENCOUNTER — Ambulatory Visit (AMBULATORY_SURGERY_CENTER): Payer: 59 | Admitting: *Deleted

## 2012-07-06 VITALS — Ht 66.0 in | Wt 165.6 lb

## 2012-07-06 DIAGNOSIS — Z1211 Encounter for screening for malignant neoplasm of colon: Secondary | ICD-10-CM

## 2012-07-06 MED ORDER — MOVIPREP 100 G PO SOLR: ORAL | Status: DC

## 2012-07-07 ENCOUNTER — Encounter: Payer: Self-pay | Admitting: Internal Medicine

## 2012-07-20 ENCOUNTER — Ambulatory Visit (AMBULATORY_SURGERY_CENTER): Payer: 59 | Admitting: Internal Medicine

## 2012-07-20 ENCOUNTER — Encounter: Payer: Self-pay | Admitting: Internal Medicine

## 2012-07-20 VITALS — BP 119/57 | HR 82 | Temp 97.4°F | Resp 24 | Ht 66.0 in | Wt 165.0 lb

## 2012-07-20 DIAGNOSIS — Z1211 Encounter for screening for malignant neoplasm of colon: Secondary | ICD-10-CM

## 2012-07-20 MED ORDER — SODIUM CHLORIDE 0.9 % IV SOLN: 500.0000 mL | INTRAVENOUS | Status: DC

## 2012-07-20 NOTE — Progress Notes (Signed)
No complaints noted in the recovery room. Maw   

## 2012-07-20 NOTE — Op Note (Signed)
[facility] [address] [place], [address]   COLONOSCOPY PROCEDURE REPORT  PATIENT: Bob Adams, Bob Adams  MR#: [id number] BIRTHDATE: 09/21/1960 , 51  yrs. old GENDER: Male ENDOSCOPIST: Roxy Cedar, MD REFERRED BY:W.  Buren Kos, M.D. PROCEDURE DATE:  07/20/2012 PROCEDURE:   Colonoscopy, screening ASA CLASS:   Class II INDICATIONS:average risk screening. MEDICATIONS: MAC sedation, administered by CRNA and propofol (Diprivan) 350mg  IV  DESCRIPTION OF PROCEDURE:   After the risks benefits and alternatives of the procedure were thoroughly explained, informed consent was obtained.  A digital rectal exam revealed no abnormalities of the rectum.   The LB [id number]  endoscope was introduced through the anus and advanced to the cecum, which was identified by both the appendix and ileocecal valve. No adverse events experienced.   The quality of the prep was excellent, using MoviPrep  The instrument was then slowly withdrawn as the colon was fully examined.      COLON FINDINGS: A normal appearing cecum, ileocecal valve, and appendiceal orifice were identified.  The ascending, hepatic flexure, transverse, splenic flexure, descending, sigmoid colon and rectum appeared unremarkable.  No polyps or cancers were seen. Retroflexed views revealed no abnormalities. The time to cecum=2 minutes 14 seconds.  Withdrawal time=9 minutes 05 seconds.  The scope was withdrawn and the procedure completed. COMPLICATIONS: There were no complications.  ENDOSCOPIC IMPRESSION: 1. Normal colon  RECOMMENDATIONS: 1. Continue current colorectal screening recommendations for "routine risk" patients with a repeat colonoscopy in 10 years.   eSigned:  Roxy Cedar, MD 07/20/2012 9:17 AM   cc: Kari Baars, MD and The Patient

## 2012-07-20 NOTE — Progress Notes (Signed)
Patient did not experience any of the following events: a burn prior to discharge; a fall within the facility; wrong site/side/patient/procedure/implant event; or a hospital transfer or hospital admission upon discharge from the facility. (G8907) Patient did not have preoperative order for IV antibiotic SSI prophylaxis. (G8918)  

## 2012-07-20 NOTE — Patient Instructions (Addendum)
Normal colonoscopy today.  You may resume your current medications today.  Please call if any questions or concerns.    YOU HAD AN ENDOSCOPIC PROCEDURE TODAY AT THE [facility]: Refer to the procedure report that was given to you for any specific questions about what was found during the examination.  If the procedure report does not answer your questions, please call your gastroenterologist to clarify.  If you requested that your care partner not be given the details of your procedure findings, then the procedure report has been included in a sealed envelope for you to review at your convenience later.  YOU SHOULD EXPECT: Some feelings of bloating in the abdomen. Passage of more gas than usual.  Walking can help get rid of the air that was put into your GI tract during the procedure and reduce the bloating. If you had a lower endoscopy (such as a colonoscopy or flexible sigmoidoscopy) you may notice spotting of blood in your stool or on the toilet paper. If you underwent a bowel prep for your procedure, then you may not have a normal bowel movement for a few days.  DIET: Your first meal following the procedure should be a light meal and then it is ok to progress to your normal diet.  A half-sandwich or bowl of soup is an example of a good first meal.  Heavy or fried foods are harder to digest and may make you feel nauseous or bloated.  Likewise meals heavy in dairy and vegetables can cause extra gas to form and this can also increase the bloating.  Drink plenty of fluids but you should avoid alcoholic beverages for 24 hours.  ACTIVITY: Your care partner should take you home directly after the procedure.  You should plan to take it easy, moving slowly for the rest of the day.  You can resume normal activity the day after the procedure however you should NOT DRIVE or use heavy machinery for 24 hours (because of the sedation medicines used during the test).    SYMPTOMS TO REPORT  IMMEDIATELY: A gastroenterologist can be reached at any hour.  During normal business hours, 8:30 AM to 5:00 PM Monday through Friday, call [phone].  After hours and on weekends, please call the GI answering service at [phone] who will take a message and have the physician on call contact you.   Following lower endoscopy (colonoscopy or flexible sigmoidoscopy):  Excessive amounts of blood in the stool  Significant tenderness or worsening of abdominal pains  Swelling of the abdomen that is new, acute  Fever of 100F or higher    FOLLOW UP: If any biopsies were taken you will be contacted by phone or by letter within the next 1-3 weeks.  Call your gastroenterologist if you have not heard about the biopsies in 3 weeks.  Our staff will call the home number listed on your records the next business day following your procedure to check on you and address any questions or concerns that you may have at that time regarding the information given to you following your procedure. This is a courtesy call and so if there is no answer at the home number and we have not heard from you through the emergency physician on call, we will assume that you have returned to your regular daily activities without incident.  SIGNATURES/CONFIDENTIALITY: You and/or your care partner have signed paperwork which will be entered into your electronic medical record.  These signatures attest to the fact  that that the information above on your After Visit Summary has been reviewed and is understood.  Full responsibility of the confidentiality of this discharge information lies with you and/or your care-partner.

## 2012-07-21 ENCOUNTER — Telehealth: Payer: Self-pay

## 2012-07-21 NOTE — Telephone Encounter (Signed)
  Follow up Call-  Call back number 07/20/2012  Post procedure Call Back phone  # [phone]  Permission to leave phone message Yes     Patient questions:  Do you have a fever, pain , or abdominal swelling? no Pain Score  0 *  Have you tolerated food without any problems? yes  Have you been able to return to your normal activities? yes  Do you have any questions about your discharge instructions: Diet   no Medications  no Follow up visit  no  Do you have questions or concerns about your Care? no  Actions: * If pain score is 4 or above: No action needed, pain <4. Spoke with wife at home.

## 2012-11-09 ENCOUNTER — Other Ambulatory Visit: Payer: Self-pay | Admitting: *Deleted

## 2012-11-09 MED ORDER — ROSUVASTATIN CALCIUM 40 MG PO TABS: 40.0000 mg | ORAL_TABLET | Freq: Every day | ORAL | Status: DC

## 2012-12-24 ENCOUNTER — Ambulatory Visit (INDEPENDENT_AMBULATORY_CARE_PROVIDER_SITE_OTHER): Payer: 59 | Admitting: Surgery

## 2012-12-30 ENCOUNTER — Encounter (INDEPENDENT_AMBULATORY_CARE_PROVIDER_SITE_OTHER): Payer: Self-pay | Admitting: Surgery

## 2013-01-11 ENCOUNTER — Ambulatory Visit (INDEPENDENT_AMBULATORY_CARE_PROVIDER_SITE_OTHER): Payer: 59 | Admitting: General Surgery

## 2013-01-11 ENCOUNTER — Encounter (INDEPENDENT_AMBULATORY_CARE_PROVIDER_SITE_OTHER): Payer: Self-pay | Admitting: General Surgery

## 2013-01-11 VITALS — BP 122/78 | HR 84 | Resp 14 | Ht 66.0 in | Wt 162.0 lb

## 2013-01-11 DIAGNOSIS — R1031 Right lower quadrant pain: Secondary | ICD-10-CM | POA: Insufficient documentation

## 2013-01-11 DIAGNOSIS — IMO0002 Reserved for concepts with insufficient information to code with codable children: Secondary | ICD-10-CM

## 2013-01-11 DIAGNOSIS — S76219A Strain of adductor muscle, fascia and tendon of unspecified thigh, initial encounter: Secondary | ICD-10-CM

## 2013-01-11 NOTE — Progress Notes (Signed)
Patient ID: Bob Adams, male   DOB: 1961-01-04, 52 y.o.   MRN: [id number]  Chief Complaint  Patient presents with  . New Evaluation    eval poss hernia    HPI Bob Adams is a 52 y.o. male.  He is a very pleasant gentleman referred by Dr. Eric Form for evaluation of bilateral groin pain.  The patient is very active physically and very active in sports. He plays competitive soccer. He goes to the gym and works out frequently. The past year he has noticed bilateral groin discomfort, occasionally during a soccer match but more so later in the evening. He does not have any pain with any other activity. He is not having any pain now. He has held off on playing soccer for a few weeks. He is asymptomatic at the present time, but is very interested in trying to get an answer.  Comorbidities include NSTEMI  on 2010, coronary stents placed. Hyperlipidemia. He has se Dr. Allie Bossier for back and knee problems but no surgery.  HPI  Past Medical History  Diagnosis Date  . Coronary artery disease   . Non Q wave myocardial infarction 06/2008  . Dyslipidemia   . GERD (gastroesophageal reflux disease)   . Allergy     Past Surgical History  Procedure Laterality Date  . Coronary stent placement  06/28/2008    SUCCESSFUL INTRACORONARY STENTING OF THE PROXIMAL LEFT ANTERIOR DESCENDING WITH THROMBUS EXTRACTION. EF 65%  . Cardiovascular stress test  10/24/2009    EF 60%  . Fracture arm    . Knee arthroscopy      Family History  Problem Relation Age of Onset  . Heart attack Mother   . Colon cancer Neg Hx     Social History History  Substance Use Topics  . Smoking status: Never Smoker   . Smokeless tobacco: Never Used  . Alcohol Use: No    No Known Allergies  Current Outpatient Prescriptions  Medication Sig Dispense Refill  . aspirin 81 MG tablet Take 81 mg by mouth daily.        . Multiple Vitamin (MULTIVITAMIN) tablet Take 1 tablet by mouth daily.      . naproxen (NAPROSYN)  500 MG tablet       . rosuvastatin (CRESTOR) 40 MG tablet Take 1 tablet (40 mg total) by mouth daily.  90 tablet  0  . fluticasone (FLONASE) 50 MCG/ACT nasal spray Place 1 spray into the nose as needed.       . nitroGLYCERIN (NITROSTAT) 0.4 MG SL tablet Place 0.4 mg under the tongue every 5 (five) minutes as needed.        . pantoprazole (PROTONIX) 40 MG tablet        No current facility-administered medications for this visit.    Review of Systems Review of Systems  Constitutional: Negative for fever, chills and unexpected weight change.  HENT: Negative for hearing loss, congestion, sore throat, trouble swallowing and voice change.   Eyes: Negative for visual disturbance.  Respiratory: Negative for cough and wheezing.   Cardiovascular: Negative for chest pain, palpitations and leg swelling.  Gastrointestinal: Negative for nausea, vomiting, abdominal pain, diarrhea, constipation, blood in stool, abdominal distention, anal bleeding and rectal pain.  Genitourinary: Negative for hematuria and difficulty urinating.  Musculoskeletal: Positive for myalgias. Negative for arthralgias.  Skin: Negative for rash and wound.  Neurological: Negative for seizures, syncope, weakness and headaches.  Hematological: Negative for adenopathy. Does not bruise/bleed easily.  Psychiatric/Behavioral:  Negative for confusion.    Blood pressure 122/78, pulse 84, resp. rate 14, height 5\' 6"  (1.676 m), weight 162 lb (73.483 kg).  Physical Exam Physical Exam  Constitutional: He is oriented to person, place, and time. He appears well-developed and well-nourished. No distress.  HENT:  Head: Normocephalic.  Nose: Nose normal.  Mouth/Throat: No oropharyngeal exudate.  Eyes: Conjunctivae and EOM are normal. Pupils are equal, round, and reactive to light. Right eye exhibits no discharge. Left eye exhibits no discharge. No scleral icterus.  Neck: Normal range of motion. Neck supple. No JVD present. No tracheal  deviation present. No thyromegaly present.  Cardiovascular: Normal rate, regular rhythm, normal heart sounds and intact distal pulses.   No murmur heard. Pulmonary/Chest: Effort normal and breath sounds normal. No stridor. No respiratory distress. He has no wheezes. He has no rales. He exhibits no tenderness.  Abdominal: Soft. Bowel sounds are normal. He exhibits no distension and no mass. There is no tenderness. There is no rebound and no guarding.  Umbilicus normal. No abdominal tenderness or mass.  Genitourinary:  No inguinal tenderness. No inguinal adenopathy. No inguinal mass or hernia. Examined supine, standing, Valsalva and cough. Penis scrotum and testes feel normal.  Musculoskeletal: Normal range of motion. He exhibits no edema and no tenderness.  Lymphadenopathy:    He has no cervical adenopathy.  Neurological: He is alert and oriented to person, place, and time. He has normal reflexes. Coordination normal.  Skin: Skin is warm and dry. No rash noted. He is not diaphoretic. No erythema. No pallor.  Psychiatric: He has a normal mood and affect. His behavior is normal. Judgment and thought content normal.    Data Reviewed Dr. Alver Fisher office notes  Assessment    Bilateral groin discomfort of uncertain etiology. Currently he is asymptomatic. His only bothers him with maximum exertion playing soccer with running and caking. Musculoskeletal pain from repetitive sports injury seems most likely. Sports hernia less likely. Urologic problems and bone and joint problems seem unlikely as well.     Plan    I discussed my impressions with him. We talked about doing nothing and waiting and seeing what happens since he is asymptomatic. We talked about doing CT scan of pelvis to rule out occult hernia or other pathology. We talked about orthopedic evaluation for possible sports injury.  At the end of the conversation he wanted to hold off on doing anything right now and see if his symptoms  returned.  If his groin pain recurs, and we will schedule for CT scan of the pelvis and followup. Otherwise see me as needed.        Angelia Mould. Derrell Lolling, M.D., [facility] General and Minimally invasive Surgery Breast and Colorectal Surgery Office:   [phone] Pager:   [phone]  01/11/2013, 3:16 PM

## 2013-01-11 NOTE — Patient Instructions (Signed)
I cannot identify a hernia or any specific cause for your bilateral groin pain.  Most likely this is a musculoskeletal type pain from repetitive injury from competitive soccer. Since the pain has now resolved, and there is no evidence of hernia on physical exam, there is no indication for surgical intervention.  We have discussed different options for management. You have  decided to wait and see if the symptoms recur. If symptoms do recur, and you would like further investigation, then we will arrange for a CT scan of the pelvis, and possible referral back to your orthopedic surgeon.   if you are otherwise doing well, nothing further needs to be done.

## 2013-01-27 ENCOUNTER — Other Ambulatory Visit: Payer: Self-pay | Admitting: Cardiology

## 2013-01-28 ENCOUNTER — Other Ambulatory Visit: Payer: Self-pay

## 2013-02-24 ENCOUNTER — Encounter (INDEPENDENT_AMBULATORY_CARE_PROVIDER_SITE_OTHER): Payer: Self-pay

## 2013-03-02 ENCOUNTER — Ambulatory Visit (INDEPENDENT_AMBULATORY_CARE_PROVIDER_SITE_OTHER): Payer: 59 | Admitting: Cardiology

## 2013-03-02 ENCOUNTER — Encounter: Payer: Self-pay | Admitting: Cardiology

## 2013-03-02 VITALS — BP 128/64 | HR 87 | Ht 66.0 in | Wt 163.0 lb

## 2013-03-02 DIAGNOSIS — I25810 Atherosclerosis of coronary artery bypass graft(s) without angina pectoris: Secondary | ICD-10-CM

## 2013-03-02 DIAGNOSIS — E785 Hyperlipidemia, unspecified: Secondary | ICD-10-CM

## 2013-03-02 DIAGNOSIS — I2510 Atherosclerotic heart disease of native coronary artery without angina pectoris: Secondary | ICD-10-CM

## 2013-03-02 NOTE — Patient Instructions (Signed)
You may resume Meloxicam for another month.  Reduce Crestor to 20 mg daily.  I will see you in 6 months.

## 2013-03-02 NOTE — Progress Notes (Signed)
Bob Adams Date of Birth: [birth year]   History of Present Illness: Bob Adams is seen today for followup. He is status post stenting of the proximal LAD in 2010 with a drug-eluting stent. He continues to do very well from a cardiac standpoint without significant chest pain or shortness of breath. He is physically very active. He was doing a lot of running this fall to stay in shape for soccer and to try and run a 7 minute mile. With this he began experiencing bilateral groin pain. He was evaluated by Dr. Derrell Lolling who saw no evidence of a sports hernia. He has been evaluated by orthopedics. He was started on meloxicam with significant improvement in his symptoms after a month. He is concerned about the possible interplay with his statin drug.  Current Outpatient Prescriptions on File Prior to Visit  Medication Sig Dispense Refill  . aspirin 81 MG tablet Take 81 mg by mouth daily.        . CRESTOR 40 MG tablet TAKE 1 TABLET DAILY .      PATIENT NEEDS TO SCHEDULE  FOLLOW UP APPOINTMENT.  90 tablet  0  . fluticasone (FLONASE) 50 MCG/ACT nasal spray Place 1 spray into the nose as needed.       . Multiple Vitamin (MULTIVITAMIN) tablet Take 1 tablet by mouth daily.      . nitroGLYCERIN (NITROSTAT) 0.4 MG SL tablet Place 0.4 mg under the tongue every 5 (five) minutes as needed.         No current facility-administered medications on file prior to visit.    No Known Allergies  Past Medical History  Diagnosis Date  . Coronary artery disease   . Non Q wave myocardial infarction 06/2008  . Dyslipidemia   . GERD (gastroesophageal reflux disease)   . Allergy     Past Surgical History  Procedure Laterality Date  . Coronary stent placement  06/28/2008    SUCCESSFUL INTRACORONARY STENTING OF THE PROXIMAL LEFT ANTERIOR DESCENDING WITH THROMBUS EXTRACTION. EF 65%  . Cardiovascular stress test  10/24/2009    EF 60%  . Fracture arm    . Knee arthroscopy      History  Smoking status  . Never  Smoker   Smokeless tobacco  . Never Used    History  Alcohol Use No    Family History  Problem Relation Age of Onset  . Heart attack Mother   . Colon cancer Neg Hx     Review of Systems:  All other systems were reviewed and are negative.  Physical Exam: BP 128/64  Pulse 87  Ht 5\' 6"  (1.676 m)  Wt 163 lb (73.936 kg)  BMI 26.32 kg/m2 He is a thin male in no acute distress. His HEENT exam is unremarkable. He has no JVD or bruits. Lungs are clear. Cardiac exam reveals a regular rate and rhythm without gallop, murmur, or click. Abdomen is soft and nontender. He has no masses or bruits. He has no edema. Pedal pulses are good. He is alert and oriented x3. Cranial nerves II through XII are intact.  LABORATORY DATA: ECG today demonstrates normal sinus rhythm with a normal ECG. Lipid panel in August of 2014 showed a total cholesterol 110, HDL 39, LDL 58, triglycerides 67. Chemistries were normal.  Assessment / Plan: 1. Coronary disease status post stenting of the proximal LAD in February 2010 with a 3.0 x 15 mm Xience stent. Clinically doing very well.given his recent groin pain we'll postpone exercise stress testing  until his next visit.  2. Hyperlipidemia on Crestor-excellent result. I recommended reducing his dose to 20 mg per day. I think it is unlikely that this is the cause of his groin pain that may be playing a contributing role.  3. Bilateral groin pain. I told him I thought it was okay for him to take meloxicam for another month.

## 2013-04-26 ENCOUNTER — Ambulatory Visit: Payer: 59 | Admitting: Cardiology

## 2013-05-23 ENCOUNTER — Other Ambulatory Visit: Payer: Self-pay | Admitting: Cardiology

## 2013-05-24 ENCOUNTER — Other Ambulatory Visit: Payer: Self-pay | Admitting: *Deleted

## 2013-05-24 MED ORDER — ROSUVASTATIN CALCIUM 20 MG PO TABS: 20.0000 mg | ORAL_TABLET | Freq: Every day | ORAL | Status: DC

## 2013-05-31 ENCOUNTER — Telehealth: Payer: Self-pay

## 2013-05-31 ENCOUNTER — Other Ambulatory Visit: Payer: Self-pay

## 2013-05-31 MED ORDER — ROSUVASTATIN CALCIUM 20 MG PO TABS: 20.0000 mg | ORAL_TABLET | Freq: Every day | ORAL | Status: DC

## 2013-05-31 NOTE — Telephone Encounter (Signed)
[facility] to get [facility] crestor 20

## 2013-05-31 NOTE — Telephone Encounter (Signed)
[facility] called to get PA for crestor 20 mg and the pa was diened because the patient needed to have tried another statin. The 40 mg did not need a PA and they did not know why

## 2013-06-01 NOTE — Telephone Encounter (Signed)
Patient called no answer.Unable to leave message no answering machine.

## 2013-06-02 LAB — IFOBT (OCCULT BLOOD): IMMUNOLOGICAL FECAL OCCULT BLOOD TEST: NEGATIVE

## 2013-06-03 NOTE — Telephone Encounter (Signed)
Spoke to patient he stated someone already refilled Crestor 20 mg.Follow up appointment scheduled with Dr.Jordan 08/12/13.Advised to call sooner if needed.

## 2013-08-12 ENCOUNTER — Ambulatory Visit (INDEPENDENT_AMBULATORY_CARE_PROVIDER_SITE_OTHER): Payer: 59 | Admitting: Cardiology

## 2013-08-12 ENCOUNTER — Encounter: Payer: Self-pay | Admitting: Cardiology

## 2013-08-12 VITALS — BP 118/64 | HR 80 | Ht 67.0 in | Wt 160.0 lb

## 2013-08-12 DIAGNOSIS — E785 Hyperlipidemia, unspecified: Secondary | ICD-10-CM

## 2013-08-12 DIAGNOSIS — I2510 Atherosclerotic heart disease of native coronary artery without angina pectoris: Secondary | ICD-10-CM

## 2013-08-12 MED ORDER — ROSUVASTATIN CALCIUM 40 MG PO TABS: 40.0000 mg | ORAL_TABLET | Freq: Every day | ORAL | Status: DC

## 2013-08-12 NOTE — Patient Instructions (Signed)
We will schedule you for a stress Echo  I will see you in 6 months. 

## 2013-08-12 NOTE — Progress Notes (Signed)
   Bob ShaveMustafa K Adams Date of Birth: 11/10/1960   History of Present Illness: Bob AmatoMustafa is seen today for followup. He is status post stenting of the proximal LAD in 2010 with a drug-eluting stent. He also had 70% RCA disease and 50% LCx disease at that time. Last Stress Echo in June 2011 was normal. He continues to do very well from a cardiac standpoint without significant chest pain or shortness of breath. He is physically active. He has been under increased stress related to his work and is having to work long hours now. Eats a very healthy diet. Had recent lab work with Dr. Clelia CroftShaw.  Current Outpatient Prescriptions on File Prior to Visit  Medication Sig Dispense Refill  . aspirin 81 MG tablet Take 81 mg by mouth daily.        . fluticasone (FLONASE) 50 MCG/ACT nasal spray Place 1 spray into the nose as needed.       . Multiple Vitamin (MULTIVITAMIN) tablet Take 1 tablet by mouth daily.      . nitroGLYCERIN (NITROSTAT) 0.4 MG SL tablet Place 0.4 mg under the tongue every 5 (five) minutes as needed.         No current facility-administered medications on file prior to visit.    No Known Allergies  Past Medical History  Diagnosis Date  . Coronary artery disease   . Non Q wave myocardial infarction 06/2008  . Dyslipidemia   . GERD (gastroesophageal reflux disease)   . Allergy     Past Surgical History  Procedure Laterality Date  . Coronary stent placement  06/28/2008    SUCCESSFUL INTRACORONARY STENTING OF THE PROXIMAL LEFT ANTERIOR DESCENDING WITH THROMBUS EXTRACTION. EF 65%  . Cardiovascular stress test  10/24/2009    EF 60%  . Fracture arm    . Knee arthroscopy      History  Smoking status  . Never Smoker   Smokeless tobacco  . Never Used    History  Alcohol Use No    Family History  Problem Relation Age of Onset  . Heart attack Mother   . Colon cancer Neg Hx     Review of Systems:  All other systems were reviewed and are negative.  Physical Exam: BP 118/64   Pulse 80  Ht 5\' 7"  (1.702 m)  Wt 160 lb (72.576 kg)  BMI 25.05 kg/m2 He is a thin male in no acute distress. His HEENT exam is unremarkable. He has no JVD or bruits. Lungs are clear. Cardiac exam reveals a regular rate and rhythm without gallop, murmur, or click. Abdomen is soft and nontender. He has no masses or bruits. He has no edema. Pedal pulses are good. He is alert and oriented x3. Cranial nerves II through XII are intact.  LABORATORY DATA:   Assessment / Plan: 1. Coronary disease status post stenting of the proximal LAD in February 2010 with a 3.0 x 15 mm Xience stent. Clinically doing very well. Will update a stress Echo at this time.  2. Hyperlipidemia on Crestor-currently taking 20 mg daily. Will request a copy of his recent lab work.

## 2013-10-01 ENCOUNTER — Other Ambulatory Visit (HOSPITAL_COMMUNITY): Payer: 59

## 2013-10-20 ENCOUNTER — Telehealth: Payer: Self-pay | Admitting: Cardiology

## 2013-10-20 NOTE — Telephone Encounter (Signed)
Received request from Nurse fax box, documents faxed for surgical clearance. To: [employer] number: [phone] Attention: 6.3.15/km

## 2014-02-09 ENCOUNTER — Telehealth: Payer: Self-pay | Admitting: Cardiology

## 2014-02-09 NOTE — Telephone Encounter (Signed)
Pt called in stating he needed to cancel his appt with Dr. [place] due to some business. He said that Dr. Sw[place] does not want him to wait til the end of the year to be seen. He wants to know if something can be done to where he can be seen sooner. Please call  Thanks

## 2014-02-11 NOTE — Telephone Encounter (Signed)
Returned call to patient he stated he needs to cancel 02/15/14 appointment with Dr.Jordan.Appointment rescheduled with Dr.Jordan 03/15/14 at 4:15 pm.

## 2014-02-15 ENCOUNTER — Ambulatory Visit: Payer: 59 | Admitting: Cardiology

## 2014-03-15 ENCOUNTER — Ambulatory Visit (INDEPENDENT_AMBULATORY_CARE_PROVIDER_SITE_OTHER): Payer: 59 | Admitting: Cardiology

## 2014-03-15 ENCOUNTER — Encounter: Payer: Self-pay | Admitting: Cardiology

## 2014-03-15 VITALS — BP 130/64 | HR 78 | Ht 66.0 in | Wt 161.9 lb

## 2014-03-15 DIAGNOSIS — I2510 Atherosclerotic heart disease of native coronary artery without angina pectoris: Secondary | ICD-10-CM

## 2014-03-15 DIAGNOSIS — E785 Hyperlipidemia, unspecified: Secondary | ICD-10-CM

## 2014-03-15 NOTE — Patient Instructions (Signed)
Call us when you are ready and we will schedule you for a stress Echo.   Continue your current therapy  I will see you in one year

## 2014-03-15 NOTE — Progress Notes (Signed)
   Bob ShaveMustafa K Adams Date of Birth: 1961/04/23   History of Present Illness: Bob Adams is seen today for followup. He is status post stenting of the proximal LAD in 2010 with a drug-eluting stent. He also had 70% RCA disease and 50% LCx disease at that time. Last Stress Echo in June 2011 was normal. He continues to do very well from a cardiac standpoint without significant chest pain or shortness of breath. He was scheduled for a stress Echo earlier this year but tore his ACL one week prior and had to have surgery. He has recovered well.   Current Outpatient Prescriptions on File Prior to Visit  Medication Sig Dispense Refill  . aspirin 81 MG tablet Take 81 mg by mouth daily.        . fluticasone (FLONASE) 50 MCG/ACT nasal spray Place 1 spray into the nose as needed.       . nitroGLYCERIN (NITROSTAT) 0.4 MG SL tablet Place 0.4 mg under the tongue every 5 (five) minutes as needed.        . rosuvastatin (CRESTOR) 40 MG tablet Take 1 tablet (40 mg total) by mouth daily.  90 tablet  3   No current facility-administered medications on file prior to visit.    No Known Allergies  Past Medical History  Diagnosis Date  . Coronary artery disease   . Non Q wave myocardial infarction 06/2008  . Dyslipidemia   . GERD (gastroesophageal reflux disease)   . Allergy     Past Surgical History  Procedure Laterality Date  . Coronary stent placement  06/28/2008    SUCCESSFUL INTRACORONARY STENTING OF THE PROXIMAL LEFT ANTERIOR DESCENDING WITH THROMBUS EXTRACTION. EF 65%  . Cardiovascular stress test  10/24/2009    EF 60%  . Fracture arm    . Knee arthroscopy      History  Smoking status  . Never Smoker   Smokeless tobacco  . Never Used    History  Alcohol Use No    Family History  Problem Relation Age of Onset  . Heart attack Mother   . Colon cancer Neg Hx     Review of Systems:  All other systems were reviewed and are negative.  Physical Exam: BP 130/64  Pulse 78  Ht 5\' 6"  (1.676  m)  Wt 161 lb 14.4 oz (73.437 kg)  BMI 26.14 kg/m2 He is a thin male in no acute distress. His HEENT exam is unremarkable. He has no JVD or bruits. Lungs are clear. Cardiac exam reveals a regular rate and rhythm without gallop, murmur, or click. Abdomen is soft and nontender. He has no masses or bruits. He has no edema. Pedal pulses are good. He is alert and oriented x3. Cranial nerves II through XII are intact.  LABORATORY DATA: Ecg today shows NSR with a normal Ecg. I have personally reviewed and interpreted this study.   Assessment / Plan: 1. Coronary disease status post stenting of the proximal LAD in February 2010 with a 3.0 x 15 mm Xience stent. Clinically doing very well. Will update a stress Echo when he has fully recovered from knee surgery.  2. Hyperlipidemia on Crestor-currently taking 20 mg daily. Lab work from January reviewed and scanned into chart. Good control.

## 2014-05-27 ENCOUNTER — Encounter: Payer: Self-pay | Admitting: Cardiology

## 2014-09-12 ENCOUNTER — Other Ambulatory Visit: Payer: Self-pay | Admitting: Cardiology

## 2014-09-14 ENCOUNTER — Telehealth: Payer: Self-pay | Admitting: Cardiology

## 2014-09-14 NOTE — Telephone Encounter (Signed)
°  1. Which medications need to be refilled? Crestor  2. Which pharmacy is medication to be sent to?[facility]  3. Do they need a 30 day or 90 day supply? 90 days and refills  4. Would they like a call back once the medication has been sent to the pharmacy? yes

## 2014-11-28 ENCOUNTER — Other Ambulatory Visit: Payer: Self-pay

## 2014-11-28 ENCOUNTER — Telehealth: Payer: Self-pay | Admitting: Cardiology

## 2014-11-28 MED ORDER — ROSUVASTATIN CALCIUM 40 MG PO TABS: 40.0000 mg | ORAL_TABLET | Freq: Every day | ORAL | Status: DC

## 2014-11-28 NOTE — Telephone Encounter (Signed)
°  1. Which medications need to be refilled? Crestor-Regular  2. Which pharmacy is medication to be sent to? [facility]  3. Do they need a 30 day or 90 day supply? 90 and refills  4. Would they like a call back once the medication has been sent to the pharmacy? yes

## 2014-11-28 NOTE — Telephone Encounter (Signed)
Patient informed. 

## 2015-04-12 ENCOUNTER — Encounter: Payer: Self-pay | Admitting: Cardiology

## 2015-04-12 ENCOUNTER — Ambulatory Visit (INDEPENDENT_AMBULATORY_CARE_PROVIDER_SITE_OTHER): Payer: 59 | Admitting: Cardiology

## 2015-04-12 VITALS — BP 118/77 | HR 78 | Ht 67.0 in | Wt 167.2 lb

## 2015-04-12 DIAGNOSIS — I2510 Atherosclerotic heart disease of native coronary artery without angina pectoris: Secondary | ICD-10-CM | POA: Diagnosis not present

## 2015-04-12 DIAGNOSIS — E785 Hyperlipidemia, unspecified: Secondary | ICD-10-CM

## 2015-04-12 NOTE — Progress Notes (Signed)
   Bob Adams Date of Birth: 02-Mar-1961   History of Present Illness: Bob AmatoMustafa is seen today for followup. He is status post stenting of the proximal LAD in 2010 with a drug-eluting stent. He also had 70% RCA disease and 50% LCx disease at that time. Last Stress Echo in June 2011 was normal. He continues to do very well from a cardiac standpoint without significant chest pain or shortness of breath. He has a new position at work and hasn't been able to exercise as much. Still enjoys playing soccer.   Current Outpatient Prescriptions on File Prior to Visit  Medication Sig Dispense Refill  . aspirin 81 MG tablet Take 81 mg by mouth daily.      . fluticasone (FLONASE) 50 MCG/ACT nasal spray Place 1 spray into the nose as needed.     . nitroGLYCERIN (NITROSTAT) 0.4 MG SL tablet Place 0.4 mg under the tongue every 5 (five) minutes as needed.      . rosuvastatin (CRESTOR) 40 MG tablet Take 1 tablet (40 mg total) by mouth daily. 90 tablet 1   No current facility-administered medications on file prior to visit.    No Known Allergies  Past Medical History  Diagnosis Date  . Coronary artery disease   . Non Q wave myocardial infarction (HCC) 06/2008  . Dyslipidemia   . GERD (gastroesophageal reflux disease)   . Allergy     Past Surgical History  Procedure Laterality Date  . Coronary stent placement  06/28/2008    SUCCESSFUL INTRACORONARY STENTING OF THE PROXIMAL LEFT ANTERIOR DESCENDING WITH THROMBUS EXTRACTION. EF 65%  . Cardiovascular stress test  10/24/2009    EF 60%  . Fracture arm    . Knee arthroscopy      History  Smoking status  . Never Smoker   Smokeless tobacco  . Never Used    History  Alcohol Use No    Family History  Problem Relation Age of Onset  . Heart attack Mother   . Colon cancer Neg Hx     Review of Systems:  All other systems were reviewed and are negative.  Physical Exam: BP 118/77 mmHg  Pulse 78  Ht 5\' 7"  (1.702 m)  Wt 75.836 kg (167 lb 3  oz)  BMI 26.18 kg/m2 He is a thin male in no acute distress. His HEENT exam is unremarkable. He has no JVD or bruits. Lungs are clear. Cardiac exam reveals a regular rate and rhythm without gallop, murmur, or click. Abdomen is soft and nontender. He has no masses or bruits. He has no edema. Pedal pulses are good. He is alert and oriented x3. Cranial nerves II through XII are intact.  LABORATORY DATA: Ecg today shows NSR with a normal Ecg. Rate 72.  I have personally reviewed and interpreted this study.   Assessment / Plan: 1. Coronary disease status post stenting of the proximal LAD in February 2010 with a 3.0 x 15 mm Xience stent. Clinically doing very well. Will update a stress test now (ETT).  2. Hyperlipidemia on Crestor-currently taking 20 mg daily.  Good control.

## 2015-04-12 NOTE — Patient Instructions (Signed)
Continue your current therapy  We will schedule you for a stress test   

## 2015-05-04 ENCOUNTER — Other Ambulatory Visit: Payer: Self-pay | Admitting: Cardiology

## 2015-05-04 MED ORDER — ROSUVASTATIN CALCIUM 40 MG PO TABS: 40.0000 mg | ORAL_TABLET | Freq: Every day | ORAL | Status: DC

## 2015-05-10 ENCOUNTER — Telehealth (HOSPITAL_COMMUNITY): Payer: Self-pay

## 2015-05-10 NOTE — Telephone Encounter (Signed)
Encounter complete. 

## 2015-05-16 ENCOUNTER — Ambulatory Visit (HOSPITAL_COMMUNITY)
Admission: RE | Admit: 2015-05-16 | Discharge: 2015-05-16 | Disposition: A | Discharge status: Home / Self Care | Payer: 59 | Source: Ambulatory Visit | Attending: Cardiovascular Disease | Admitting: Cardiovascular Disease

## 2015-05-16 DIAGNOSIS — I2510 Atherosclerotic heart disease of native coronary artery without angina pectoris: Secondary | ICD-10-CM | POA: Insufficient documentation

## 2015-05-16 DIAGNOSIS — E785 Hyperlipidemia, unspecified: Secondary | ICD-10-CM | POA: Diagnosis not present

## 2015-05-16 LAB — EXERCISE TOLERANCE TEST
CHL CUP MPHR: 165 {beats}/min
CHL RATE OF PERCEIVED EXERTION: 16
CSEPEW: 17.2 METS
Exercise duration (min): 14 min
Exercise duration (sec): 1 s
Peak HR: 190 {beats}/min
Percent HR: 115 %
Rest HR: 85 {beats}/min

## 2016-02-04 ENCOUNTER — Other Ambulatory Visit: Payer: Self-pay | Admitting: Cardiology

## 2016-03-04 ENCOUNTER — Ambulatory Visit (INDEPENDENT_AMBULATORY_CARE_PROVIDER_SITE_OTHER): Payer: Self-pay | Admitting: Sports Medicine

## 2016-05-05 NOTE — Progress Notes (Signed)
Bob ShaveMustafa K Adams Date of Birth: December 25, 1960   History of Present Illness: Bob AmatoMustafa is seen today for followup. He is status post stenting of the proximal LAD in 2010 with a drug-eluting stent. He also had 70% RCA disease and 50% LCx disease at that time.  Stress Echo in June 2011 was normal. ETT in Dec. 2016 was normal.  He continues to do very well from a cardiac standpoint without  chest pain or shortness of breath. He has lost 10 lbs.    Current Outpatient Prescriptions on File Prior to Visit  Medication Sig Dispense Refill  . aspirin 81 MG tablet Take 81 mg by mouth daily.      . fluticasone (FLONASE) 50 MCG/ACT nasal spray Place 1 spray into the nose as needed.     . nitroGLYCERIN (NITROSTAT) 0.4 MG SL tablet Place 0.4 mg under the tongue every 5 (five) minutes as needed.      . rosuvastatin (CRESTOR) 40 MG tablet TAKE 1 TABLET DAILY 90 tablet 2   No current facility-administered medications on file prior to visit.     No Known Allergies  Past Medical History:  Diagnosis Date  . Allergy   . Coronary artery disease   . Dyslipidemia   . GERD (gastroesophageal reflux disease)   . Non Q wave myocardial infarction [facility](HCC) 06/2008    Past Surgical History:  Procedure Laterality Date  . CARDIOVASCULAR STRESS TEST  10/24/2009   EF 60%  . CORONARY STENT PLACEMENT  06/28/2008   SUCCESSFUL INTRACORONARY STENTING OF THE PROXIMAL LEFT ANTERIOR DESCENDING WITH THROMBUS EXTRACTION. EF 65%  . fracture arm    . KNEE ARTHROSCOPY      History  Smoking Status  . Never Smoker  Smokeless Tobacco  . Never Used    History  Alcohol Use No    Family History  Problem Relation Age of Onset  . Heart attack Mother   . Colon cancer Neg Hx     Review of Systems:  All other systems were reviewed and are negative.  Physical Exam: BP 112/70   Pulse 71   Ht 5\' 7"  (1.702 m)   Wt 157 lb 3.2 oz (71.3 kg)   SpO2 99%   BMI 24.62 kg/m  He is a thin male in no acute distress. His HEENT exam  is unremarkable. He has no JVD or bruits. Lungs are clear. Cardiac exam reveals a regular rate and rhythm without gallop, murmur, or click. Abdomen is soft and nontender. He has no masses or bruits. He has no edema. Pedal pulses are good. He is alert and oriented x3. Cranial nerves II through XII are intact.  LABORATORY DATA:  Dated 06/09/15: cholesterol 117, triglycerides 77, LDL 71, HDL 31. A1c 4.5%. CMET, CBC, TSH normal  Ecg today shows NSR with a normal Ecg. Rate 71.  I have personally reviewed and interpreted this study.  ETT 05/15/16:Study Highlights    There was no ST segment deviation noted during stress.   ETT with excellent exercise tolerance (14:01); no chest pain; normal BP response; no ST changes; negative adequate ETT.   Stress Findings   ECG Baseline ECG exhibits normal sinus rhythm..    Stress Findings The patient exercised following the Bruce protocol.  The patient reported no symptoms during the stress test. The patient experienced no angina during the stress test.   The test was stopped because the patient complained of fatigue.   Blood pressure and heart rate demonstrated a normal response to exercise.  Overall, the patient's exercise capacity was excellent.   85% of maximum heart rate was achieved after 8 minutes. Recovery time: 15 minutes.  Duke Treadmill Score: intermediate risk The patient's response to exercise was adequate for diagnosis.    Response to Stress There was no ST segment deviation noted during stress.  Arrhythmias during stress: none.  Arrhythmias during recovery: none.  There were no significant arrhythmias noted during the test.  ECG was interpretable and there was no significant change from baseline.    Stress Measurements   Baseline Vitals  Rest HR 85 bpm    Rest BP 117/74 mmHg    Exercise Time  Exercise duration (min) 14 min    Exercise duration (sec) 1 sec    Peak Stress Vitals  Peak HR 190 bpm    Peak BP 129/67 mmHg     Exercise Data  MPHR 165 bpm    Percent HR 115 %    RPE 16     Estimated workload 17.2 METS        Assessment / Plan: 1. Coronary disease status post stenting of the proximal LAD in February 2010 with a 3.0 x 15 mm Xience stent. ETT in Dec. 2016 was normal. He is asymptomatic. Continue current therapy and follow up in one year.  2. Hyperlipidemia on Crestor-currently taking 20 mg daily.  Excellent control.

## 2016-05-07 ENCOUNTER — Encounter: Payer: Self-pay | Admitting: Cardiology

## 2016-05-07 ENCOUNTER — Ambulatory Visit (INDEPENDENT_AMBULATORY_CARE_PROVIDER_SITE_OTHER): Payer: 59 | Admitting: Cardiology

## 2016-05-07 VITALS — BP 112/70 | HR 71 | Ht 67.0 in | Wt 157.2 lb

## 2016-05-07 DIAGNOSIS — I2510 Atherosclerotic heart disease of native coronary artery without angina pectoris: Secondary | ICD-10-CM

## 2016-05-07 DIAGNOSIS — E785 Hyperlipidemia, unspecified: Secondary | ICD-10-CM | POA: Diagnosis not present

## 2016-05-07 NOTE — Patient Instructions (Signed)
Continue your current therapy  I will see you in one year   

## 2016-06-14 DIAGNOSIS — Z0000 Encounter for general adult medical examination without abnormal findings: Secondary | ICD-10-CM | POA: Diagnosis not present

## 2016-06-21 DIAGNOSIS — E784 Other hyperlipidemia: Secondary | ICD-10-CM | POA: Diagnosis not present

## 2016-06-21 DIAGNOSIS — Z1389 Encounter for screening for other disorder: Secondary | ICD-10-CM | POA: Diagnosis not present

## 2016-06-21 DIAGNOSIS — Z0000 Encounter for general adult medical examination without abnormal findings: Secondary | ICD-10-CM | POA: Diagnosis not present

## 2016-06-21 DIAGNOSIS — R7301 Impaired fasting glucose: Secondary | ICD-10-CM | POA: Diagnosis not present

## 2016-06-21 DIAGNOSIS — Z125 Encounter for screening for malignant neoplasm of prostate: Secondary | ICD-10-CM | POA: Diagnosis not present

## 2016-06-21 DIAGNOSIS — Z9861 Coronary angioplasty status: Secondary | ICD-10-CM | POA: Diagnosis not present

## 2016-06-21 DIAGNOSIS — H8110 Benign paroxysmal vertigo, unspecified ear: Secondary | ICD-10-CM | POA: Insufficient documentation

## 2016-06-21 DIAGNOSIS — Z23 Encounter for immunization: Secondary | ICD-10-CM | POA: Diagnosis not present

## 2016-06-25 DIAGNOSIS — Z1212 Encounter for screening for malignant neoplasm of rectum: Secondary | ICD-10-CM | POA: Diagnosis not present

## 2016-08-05 DIAGNOSIS — D698 Other specified hemorrhagic conditions: Secondary | ICD-10-CM | POA: Diagnosis not present

## 2016-11-13 ENCOUNTER — Ambulatory Visit (INDEPENDENT_AMBULATORY_CARE_PROVIDER_SITE_OTHER): Payer: 59

## 2016-11-13 ENCOUNTER — Ambulatory Visit (INDEPENDENT_AMBULATORY_CARE_PROVIDER_SITE_OTHER): Payer: 59 | Admitting: Orthopedic Surgery

## 2016-11-13 ENCOUNTER — Encounter (INDEPENDENT_AMBULATORY_CARE_PROVIDER_SITE_OTHER): Payer: Self-pay | Admitting: Orthopedic Surgery

## 2016-11-13 DIAGNOSIS — M25512 Pain in left shoulder: Secondary | ICD-10-CM

## 2016-11-13 MED ORDER — BUPIVACAINE HCL 0.5 % IJ SOLN
9.0000 mL | INTRAMUSCULAR | Status: AC | PRN
  Administered 2016-11-13: 9 mL via INTRA_ARTICULAR

## 2016-11-13 MED ORDER — LIDOCAINE HCL 1 % IJ SOLN
5.0000 mL | INTRAMUSCULAR | Status: AC | PRN
  Administered 2016-11-13: 5 mL

## 2016-11-13 MED ORDER — METHYLPREDNISOLONE ACETATE 40 MG/ML IJ SUSP
40.0000 mg | INTRAMUSCULAR | Status: AC | PRN
  Administered 2016-11-13: 40 mg via INTRA_ARTICULAR

## 2016-11-13 NOTE — Progress Notes (Signed)
Office Visit Note   Patient: Bob Adams           Date of Birth: [birth year]           MRN: [id number] Visit Date: 11/13/2016 Requested by: Martha Clan, MD [address] [place], [place] [address] PCP: Martha Clan, MD  Subjective: Chief Complaint  Patient presents with  . Left Shoulder - Pain    HPI: List also is a 56 year old patient with left shoulder pain for several weeks.  He feels like he may have hurt it in October when he was pushed and landed on his shoulder.  Travel tearing pain but it didn't help much.  He reports pain at rest.  Localizes the pain to the anterior shoulder biceps region.  Denies any numbness and tingling denies any neck pain.  Pain is worse at night.              ROS: All systems reviewed are negative as they relate to the chief complaint within the history of present illness.  Patient denies  fevers or chills.   Assessment & Plan: Visit Diagnoses:  1. Acute pain of left shoulder     Plan: Impression is early frozen shoulder on the left-hand side.  Does not have evidence of rotator cuff weakness or crepitus.  Radiographs are normal.  Plan is injection into the left shoulder today with a 3 week course of ibuprofen 400 mg twice a day.  Also send him to physical therapy for shoulder range of motion and strengthening.  He needs more stretching than anything else.  We will have him do that with [facility].  I'll see him back in 6 weeks for repeat injection if he is not improving  Follow-Up Instructions: Return if symptoms worsen or fail to improve.   Orders:  Orders Placed This Encounter  Procedures  . XR Shoulder Left   No orders of the defined types were placed in this encounter.     Procedures: Large Joint Inj Date/Time: 11/13/2016 11:43 PM Performed by: Cammy Copa Authorized by: Cammy Copa   Consent Given by:  Patient Site marked: the procedure site was marked   Timeout: prior to procedure the correct patient,  procedure, and site was verified   Indications:  Pain and diagnostic evaluation Location:  Shoulder Site:  L glenohumeral Prep: patient was prepped and draped in usual sterile fashion   Needle Size:  18 G Needle Length:  1.5 inches Approach:  Posterior Ultrasound Guidance: No   Fluoroscopic Guidance: No   Arthrogram: No   Medications:  5 mL lidocaine 1 %; 9 mL bupivacaine 0.5 %; 40 mg methylPREDNISolone acetate 40 MG/ML Aspiration Attempted: No   Patient tolerance:  Patient tolerated the procedure well with no immediate complications     Clinical Data: No additional findings.  Objective: Vital Signs: There were no vitals taken for this visit.  Physical Exam:   Constitutional: Patient appears well-developed HEENT:  Head: Normocephalic Eyes:EOM are normal Neck: Normal range of motion Cardiovascular: Normal rate Pulmonary/chest: Effort normal Neurologic: Patient is alert Skin: Skin is warm Psychiatric: Patient has normal mood and affect    Ortho Exam: Orthopedic exam demonstrates good cervical spine range of motion 5 out of 5 grip EPL FPL interosseous flexion-extension biceps triceps and deltoid strength.  No course grinding or crepitus with active or passive range of motion of the left shoulder.  He does lack about 25 of forward flexion on the left compared to the right.  He also lacks about 20 of passive glenohumeral abduction left versus right.  External rotation at 15 of abduction and also 10-15 difference left versus right.  Impingement signs negative no acromioclavicular joint tenderness.  Specialty Comments:  No specialty comments available.  Imaging: Xr Shoulder Left  Result Date: 11/13/2016 AP outlet and axillary view left shoulder reviewed.  Visualized lung fields clear.  Bony clavicle scapula and proximal humerus normal.  Shoulder located.  No fractures present.    PMFS History: Patient Active Problem List   Diagnosis Date Noted  . Bilateral groin  pain 01/11/2013  . Coronary artery disease   . Dyslipidemia   . Non Q wave myocardial infarction (HCC) 06/20/2008   Past Medical History:  Diagnosis Date  . Allergy   . Coronary artery disease   . Dyslipidemia   . GERD (gastroesophageal reflux disease)   . Non Q wave myocardial infarction [facility](HCC) 06/2008    Family History  Problem Relation Age of Onset  . Heart attack Mother   . Colon cancer Neg Hx     Past Surgical History:  Procedure Laterality Date  . CARDIOVASCULAR STRESS TEST  10/24/2009   EF 60%  . CORONARY STENT PLACEMENT  06/28/2008   SUCCESSFUL INTRACORONARY STENTING OF THE PROXIMAL LEFT ANTERIOR DESCENDING WITH THROMBUS EXTRACTION. EF 65%  . fracture arm    . KNEE ARTHROSCOPY     Social History   Occupational History  . engineer [employer]   Social History Main Topics  . Smoking status: Never Smoker  . Smokeless tobacco: Never Used  . Alcohol use No  . Drug use: No  . Sexual activity: Not on file

## 2016-11-18 ENCOUNTER — Ambulatory Visit (INDEPENDENT_AMBULATORY_CARE_PROVIDER_SITE_OTHER): Payer: Self-pay | Admitting: Orthopaedic Surgery

## 2016-11-18 DIAGNOSIS — M7502 Adhesive capsulitis of left shoulder: Secondary | ICD-10-CM | POA: Diagnosis not present

## 2016-11-18 DIAGNOSIS — M25512 Pain in left shoulder: Secondary | ICD-10-CM | POA: Diagnosis not present

## 2016-11-20 ENCOUNTER — Other Ambulatory Visit: Payer: Self-pay | Admitting: Cardiology

## 2016-11-22 DIAGNOSIS — M7502 Adhesive capsulitis of left shoulder: Secondary | ICD-10-CM | POA: Diagnosis not present

## 2016-11-22 DIAGNOSIS — M25512 Pain in left shoulder: Secondary | ICD-10-CM | POA: Diagnosis not present

## 2016-11-27 DIAGNOSIS — M25512 Pain in left shoulder: Secondary | ICD-10-CM | POA: Diagnosis not present

## 2016-11-27 DIAGNOSIS — M7502 Adhesive capsulitis of left shoulder: Secondary | ICD-10-CM | POA: Diagnosis not present

## 2016-11-28 DIAGNOSIS — M25512 Pain in left shoulder: Secondary | ICD-10-CM | POA: Diagnosis not present

## 2016-11-28 DIAGNOSIS — M7502 Adhesive capsulitis of left shoulder: Secondary | ICD-10-CM | POA: Diagnosis not present

## 2016-12-02 DIAGNOSIS — M25512 Pain in left shoulder: Secondary | ICD-10-CM | POA: Diagnosis not present

## 2016-12-02 DIAGNOSIS — M7502 Adhesive capsulitis of left shoulder: Secondary | ICD-10-CM | POA: Diagnosis not present

## 2016-12-05 DIAGNOSIS — M25512 Pain in left shoulder: Secondary | ICD-10-CM | POA: Diagnosis not present

## 2016-12-05 DIAGNOSIS — M7502 Adhesive capsulitis of left shoulder: Secondary | ICD-10-CM | POA: Diagnosis not present

## 2016-12-12 DIAGNOSIS — M25512 Pain in left shoulder: Secondary | ICD-10-CM | POA: Diagnosis not present

## 2016-12-12 DIAGNOSIS — M7502 Adhesive capsulitis of left shoulder: Secondary | ICD-10-CM | POA: Diagnosis not present

## 2016-12-13 DIAGNOSIS — M25512 Pain in left shoulder: Secondary | ICD-10-CM | POA: Diagnosis not present

## 2016-12-13 DIAGNOSIS — M7502 Adhesive capsulitis of left shoulder: Secondary | ICD-10-CM | POA: Diagnosis not present

## 2016-12-17 DIAGNOSIS — M7502 Adhesive capsulitis of left shoulder: Secondary | ICD-10-CM | POA: Diagnosis not present

## 2016-12-17 DIAGNOSIS — M25512 Pain in left shoulder: Secondary | ICD-10-CM | POA: Diagnosis not present

## 2016-12-19 DIAGNOSIS — M25512 Pain in left shoulder: Secondary | ICD-10-CM | POA: Diagnosis not present

## 2016-12-19 DIAGNOSIS — M7502 Adhesive capsulitis of left shoulder: Secondary | ICD-10-CM | POA: Diagnosis not present

## 2016-12-23 DIAGNOSIS — M25512 Pain in left shoulder: Secondary | ICD-10-CM | POA: Diagnosis not present

## 2016-12-23 DIAGNOSIS — M7502 Adhesive capsulitis of left shoulder: Secondary | ICD-10-CM | POA: Diagnosis not present

## 2016-12-25 DIAGNOSIS — M25512 Pain in left shoulder: Secondary | ICD-10-CM | POA: Diagnosis not present

## 2016-12-25 DIAGNOSIS — M7502 Adhesive capsulitis of left shoulder: Secondary | ICD-10-CM | POA: Diagnosis not present

## 2016-12-31 DIAGNOSIS — M7502 Adhesive capsulitis of left shoulder: Secondary | ICD-10-CM | POA: Diagnosis not present

## 2016-12-31 DIAGNOSIS — M25512 Pain in left shoulder: Secondary | ICD-10-CM | POA: Diagnosis not present

## 2017-01-02 DIAGNOSIS — M25512 Pain in left shoulder: Secondary | ICD-10-CM | POA: Diagnosis not present

## 2017-01-02 DIAGNOSIS — M7502 Adhesive capsulitis of left shoulder: Secondary | ICD-10-CM | POA: Diagnosis not present

## 2017-04-15 NOTE — Progress Notes (Signed)
Bob Adams Date of Birth: 1961-02-28   History of Present Illness: Bob Adams is seen today for followup. He is status post stenting of the proximal LAD in 2010 with a drug-eluting stent. He also had 70% RCA disease and 50% LCx disease at that time.  Stress Echo in June 2011 was normal. ETT in Dec. 2017 was normal. On follow up today he is doing very well. He goes to the gym 5-7 days a week and exercises vigorously for 45 minutes. Denies any chest pain, SOB, palpitations or any other complaints. Feels great.   Current Outpatient Medications on File Prior to Visit  Medication Sig Dispense Refill  . aspirin 81 MG tablet Take 81 mg by mouth daily.      . nitroGLYCERIN (NITROSTAT) 0.4 MG SL tablet Place 0.4 mg under the tongue every 5 (five) minutes as needed.      . rosuvastatin (CRESTOR) 40 MG tablet TAKE 1 TABLET DAILY 90 tablet 2   No current facility-administered medications on file prior to visit.     No Known Allergies  Past Medical History:  Diagnosis Date  . Allergy   . Coronary artery disease   . Dyslipidemia   . GERD (gastroesophageal reflux disease)   . Non Q wave myocardial infarction [facility](HCC) 06/2008    Past Surgical History:  Procedure Laterality Date  . CARDIOVASCULAR STRESS TEST  10/24/2009   EF 60%  . CORONARY STENT PLACEMENT  06/28/2008   SUCCESSFUL INTRACORONARY STENTING OF THE PROXIMAL LEFT ANTERIOR DESCENDING WITH THROMBUS EXTRACTION. EF 65%  . fracture arm    . KNEE ARTHROSCOPY      Social History   Tobacco Use  Smoking Status Never Smoker  Smokeless Tobacco Never Used    Social History   Substance and Sexual Activity  Alcohol Use No    Family History  Problem Relation Age of Onset  . Heart attack Mother   . Colon cancer Neg Hx     Review of Systems:  All other systems were reviewed and are negative.  Physical Exam: BP 110/60   Pulse 80   Ht 5\' 7"  (1.702 m)   Wt 155 lb 3.2 oz (70.4 kg)   SpO2 97%   BMI 24.31 kg/m  GENERAL:  Well  appearing male in NAD HEENT:  PERRL, EOMI, sclera are clear. Oropharynx is clear. NECK:  No jugular venous distention, carotid upstroke brisk and symmetric, no bruits, no thyromegaly or adenopathy LUNGS:  Clear to auscultation bilaterally CHEST:  Unremarkable HEART:  RRR,  PMI not displaced or sustained,S1 and S2 within normal limits, no S3, no S4: no clicks, no rubs, no murmurs ABD:  Soft, nontender. BS +, no masses or bruits. No hepatomegaly, no splenomegaly EXT:  2 + pulses throughout, no edema, no cyanosis no clubbing SKIN:  Warm and dry.  No rashes NEURO:  Alert and oriented x 3. Cranial nerves II through XII intact. PSYCH:  Cognitively intact    LABORATORY DATA:  Dated 06/09/15: cholesterol 117, triglycerides 77, LDL 71, HDL 31. A1c 4.5%. CMET, CBC, TSH normal Dated 06/14/16: cholesterol 117, triglycerides 65, HDL 41, LDL 63, A1c 4.3%. Hgb 12.3. Chemistries normal.  Ecg today shows NSR with a normal Ecg. Rate 73.  I have personally reviewed and interpreted this study.  ETT 05/15/16:Study Highlights    There was no ST segment deviation noted during stress.   ETT with excellent exercise tolerance (14:01); no chest pain; normal BP response; no ST changes; negative adequate ETT.  Stress Findings   ECG Baseline ECG exhibits normal sinus rhythm..    Stress Findings The patient exercised following the Bruce protocol.  The patient reported no symptoms during the stress test. The patient experienced no angina during the stress test.   The test was stopped because the patient complained of fatigue.   Blood pressure and heart rate demonstrated a normal response to exercise. Overall, the patient's exercise capacity was excellent.   85% of maximum heart rate was achieved after 8 minutes. Recovery time: 15 minutes.  Duke Treadmill Score: intermediate risk The patient's response to exercise was adequate for diagnosis.    Response to Stress There was no ST segment deviation noted  during stress.  Arrhythmias during stress: none.  Arrhythmias during recovery: none.  There were no significant arrhythmias noted during the test.  ECG was interpretable and there was no significant change from baseline.    Stress Measurements   Baseline Vitals  Rest HR 85 bpm    Rest BP 117/74 mmHg    Exercise Time  Exercise duration (min) 14 min    Exercise duration (sec) 1 sec    Peak Stress Vitals  Peak HR 190 bpm    Peak BP 129/67 mmHg    Exercise Data  MPHR 165 bpm    Percent HR 115 %    RPE 16     Estimated workload 17.2 METS        Assessment / Plan: 1. Coronary disease status post stenting of the proximal LAD in February 2010 with a 3.0 x 15 mm Xience stent. ETT in Dec. 2017 was normal. He remains  Asymptomatic and is exercising regularly. Continue current therapy with ASA and statin.   2. Hyperlipidemia on Crestor- Excellent control.  Follow up in one year.

## 2017-04-18 ENCOUNTER — Ambulatory Visit: Payer: 59 | Admitting: Cardiology

## 2017-04-18 ENCOUNTER — Encounter: Payer: Self-pay | Admitting: Cardiology

## 2017-04-18 VITALS — BP 110/60 | HR 80 | Ht 67.0 in | Wt 155.2 lb

## 2017-04-18 DIAGNOSIS — E785 Hyperlipidemia, unspecified: Secondary | ICD-10-CM

## 2017-04-18 DIAGNOSIS — I2510 Atherosclerotic heart disease of native coronary artery without angina pectoris: Secondary | ICD-10-CM

## 2017-04-18 NOTE — Patient Instructions (Signed)
Continue your current therapy  I will see you in one year   

## 2017-06-20 DIAGNOSIS — R82998 Other abnormal findings in urine: Secondary | ICD-10-CM | POA: Diagnosis not present

## 2017-06-20 DIAGNOSIS — Z125 Encounter for screening for malignant neoplasm of prostate: Secondary | ICD-10-CM | POA: Diagnosis not present

## 2017-06-20 DIAGNOSIS — E7849 Other hyperlipidemia: Secondary | ICD-10-CM | POA: Diagnosis not present

## 2017-06-25 DIAGNOSIS — E7849 Other hyperlipidemia: Secondary | ICD-10-CM | POA: Diagnosis not present

## 2017-06-25 DIAGNOSIS — Z9861 Coronary angioplasty status: Secondary | ICD-10-CM | POA: Diagnosis not present

## 2017-06-25 DIAGNOSIS — R7301 Impaired fasting glucose: Secondary | ICD-10-CM | POA: Diagnosis not present

## 2017-06-25 DIAGNOSIS — Z1212 Encounter for screening for malignant neoplasm of rectum: Secondary | ICD-10-CM | POA: Diagnosis not present

## 2017-06-25 DIAGNOSIS — Z1389 Encounter for screening for other disorder: Secondary | ICD-10-CM | POA: Diagnosis not present

## 2017-06-25 DIAGNOSIS — E785 Hyperlipidemia, unspecified: Secondary | ICD-10-CM | POA: Diagnosis not present

## 2017-07-21 ENCOUNTER — Other Ambulatory Visit: Payer: Self-pay | Admitting: General Surgery

## 2017-07-21 DIAGNOSIS — I2510 Atherosclerotic heart disease of native coronary artery without angina pectoris: Secondary | ICD-10-CM | POA: Diagnosis not present

## 2017-07-21 DIAGNOSIS — Z9861 Coronary angioplasty status: Secondary | ICD-10-CM | POA: Diagnosis not present

## 2017-07-21 DIAGNOSIS — K4090 Unilateral inguinal hernia, without obstruction or gangrene, not specified as recurrent: Secondary | ICD-10-CM | POA: Diagnosis not present

## 2017-07-22 ENCOUNTER — Telehealth: Payer: Self-pay | Admitting: *Deleted

## 2017-07-22 NOTE — Telephone Encounter (Signed)
   [facility] Pre-operative Risk Assessment    Request for surgical clearance:  1. What type of surgery is being performed? Hernia repair   2. When is this surgery scheduled? TBD   3. What type of clearance is required (medical clearance vs. Pharmacy clearance to hold med vs. Both)? both  4. Are there any medications that need to be held prior to surgery and how long? ASA   5. Practice name and name of physician performing surgery? [facility]    6. What is your office phone and fax number? [phone] attn: Benjiman Core, CMA   7. Anesthesia type (None, local, MAC, general) ? general   Bob Adams Bob Adams 07/22/2017, 2:30 PM  _________________________________________________________________   (provider comments below)

## 2017-07-24 NOTE — Telephone Encounter (Signed)
Left the patient a phone message for him to call back and speak to the on call cardiology APP for final clearance. Will also forward to Dr. SwazilandJordan to see if ok to hold ASA 5-7 days prior to hernia surgery if the patient is stable from symptom perspective.

## 2017-07-25 NOTE — Telephone Encounter (Signed)
It would be ok to hold ASA for surgery  Bob Huebert SwazilandJordan MD, [facility] System[facility]ACC

## 2017-07-25 NOTE — Telephone Encounter (Signed)
See Dr. Elvis CoilJordan's message

## 2017-07-28 NOTE — Telephone Encounter (Signed)
   Primary Cardiologist: Dr. SwazilandJordan Chart reviewed as part of pre-operative protocol coverage. Patient was contacted 07/28/2017 in reference to pre-operative risk assessment for pending surgery as outlined below.  Bob ShaveMustafa K Vicario was last seen on 04/18/17 by Dr. SwazilandJordan.  Since that day, Bob Adams has done well w/o exertional CP or dyspnea. He works out at [employer] gym vigorously several days a week w/o limitations or anginal symptomotology.  Therefore, based on ACC/AHA guidelines, the patient would be at acceptable risk for the planned procedure without further cardiovascular testing.    Per Dr. SwazilandJordan, ok to hold ASA for 5-7 days prior to surgery (see his comments below).   I will route this recommendation to the requesting party via Epic fax function and remove from pre-op pool.  Please call with questions.  Robbie LisBrittainy Avice [place], PA-C 07/28/2017, 2:04 PM

## 2017-08-18 ENCOUNTER — Encounter (INDEPENDENT_AMBULATORY_CARE_PROVIDER_SITE_OTHER): Payer: Self-pay | Admitting: Orthopaedic Surgery

## 2017-08-18 ENCOUNTER — Ambulatory Visit (INDEPENDENT_AMBULATORY_CARE_PROVIDER_SITE_OTHER): Payer: 59 | Admitting: Orthopaedic Surgery

## 2017-08-18 DIAGNOSIS — M65311 Trigger thumb, right thumb: Secondary | ICD-10-CM

## 2017-08-18 MED ORDER — LIDOCAINE HCL 1 % IJ SOLN
1.0000 mL | INTRAMUSCULAR | Status: AC | PRN
  Administered 2017-08-18: 1 mL

## 2017-08-18 MED ORDER — METHYLPREDNISOLONE ACETATE 40 MG/ML IJ SUSP
40.0000 mg | INTRAMUSCULAR | Status: AC | PRN
  Administered 2017-08-18: 40 mg

## 2017-08-18 NOTE — Progress Notes (Signed)
Office Visit Note   Patient: Bob Adams           Date of Birth: Jul 27, 1960           MRN: [id number] Visit Date: 08/18/2017              Requested by: Martha ClanShaw, William, MD [address] [place], [place] [address] PCP: Martha ClanShaw, William, MD   Assessment & Plan: Visit Diagnoses:  1. Trigger thumb, right thumb     Plan: I talked to him about trying a steroid injection in his right thumb A1 pulley and he agreed with this.  He tolerated well.  He knows to come back in about 6-8 weeks only if there is still pain and triggering and we would try at least one more repeat injection.  Otherwise he will follow-up as needed.  All questions and concerns were answered and addressed.  Follow-Up Instructions: Return if symptoms worsen or fail to improve.   Orders:  Orders Placed This Encounter  Procedures  . Hand/UE Inj   No orders of the defined types were placed in this encounter.     Procedures: Hand/UE Inj: R thumb A1 for trigger finger on 08/18/2017 10:41 AM Medications: 1 mL lidocaine 1 %; 40 mg methylPREDNISolone acetate 40 MG/ML      Clinical Data: No additional findings.   Subjective: Chief Complaint  Patient presents with  . Right Hand - Pain  The patient is well-known to me.  He comes in today with right thumb pain with active triggering and pointing to the A1 pulley of his right dominant hand the source of his pain of his right thumb.  He denies any specific injuries.  Denies any numbness and tingling.  Has never had this before.  He tried to wear a splint at night to see if this will help but has not.  HPI  Review of Systems He currently denies any headache, chest pain, shortness of breath, fever, chills, nausea, vomiting.  Objective: Vital Signs: There were no vitals taken for this visit.  Physical Exam He is alert and oriented x3 and in no acute distress Ortho Exam Examination of his right hand shows pain over the A1 pulley of the right thumb with active  triggering.  He is otherwise neurovascularly intact in the thumb is stable. Specialty Comments:  No specialty comments available.  Imaging: No results found.   PMFS History: Patient Active Problem List   Diagnosis Date Noted  . Trigger thumb, right thumb 08/18/2017  . Bilateral groin pain 01/11/2013  . Coronary artery disease   . Dyslipidemia   . Non Q wave myocardial infarction (HCC) 06/20/2008   Past Medical History:  Diagnosis Date  . Allergy   . Coronary artery disease   . Dyslipidemia   . GERD (gastroesophageal reflux disease)   . Non Q wave myocardial infarction [facility](HCC) 06/2008    Family History  Problem Relation Age of Onset  . Heart attack Mother   . Colon cancer Neg Hx     Past Surgical History:  Procedure Laterality Date  . CARDIOVASCULAR STRESS TEST  10/24/2009   EF 60%  . CORONARY STENT PLACEMENT  06/28/2008   SUCCESSFUL INTRACORONARY STENTING OF THE PROXIMAL LEFT ANTERIOR DESCENDING WITH THROMBUS EXTRACTION. EF 65%  . fracture arm    . KNEE ARTHROSCOPY     Social History   Occupational History  . Occupation: Garment/textile technologistengineer    Employer: [employer]    Comment: [employer]  Tobacco Use  .  Smoking status: Never Smoker  . Smokeless tobacco: Never Used  Substance and Sexual Activity  . Alcohol use: No  . Drug use: No  . Sexual activity: Not on file

## 2017-08-28 ENCOUNTER — Other Ambulatory Visit: Payer: Self-pay | Admitting: Cardiology

## 2017-11-11 ENCOUNTER — Encounter (HOSPITAL_BASED_OUTPATIENT_CLINIC_OR_DEPARTMENT_OTHER): Payer: Self-pay | Admitting: *Deleted

## 2017-11-11 ENCOUNTER — Other Ambulatory Visit: Payer: Self-pay

## 2017-11-12 NOTE — Progress Notes (Signed)
Pt given Ensure and instructed to drink by 0730 day of surgery.

## 2017-11-14 ENCOUNTER — Other Ambulatory Visit: Payer: Self-pay | Admitting: General Surgery

## 2017-11-15 NOTE — H&P (Signed)
Bob Adams Location: [facility] Patient #: [id number] DOB: 10-18-1960 Married / Language: English / Race: Refused to Report/Unreported Male       History of Present Illness        The patient is a 57 year old male who presents with an inguinal hernia. This is a very pleasant 57 year old gentleman, referred by Dr. Sandra Cockayne for evaluation of right inguinal hernia. Bob Adams is his cardiologist.     He thinks he was evaluated for right groin pain 3 years ago. He thinks he was evaluated here but we do not have a record. He saw Dr. Allie Bossier and nothing was found. He's noticed a bulge the past 2 or 3 months. Always reducible. No history of incarceration. No prior history of hernia surgery. Dr.Shaw diagnosed a right inguinal hernia. He is referred for surgical opinion and elective repair      Past history reveals cardiac catheterization and PTCA in 2010. He takes aspirin and statins. No chest pain or shortness of breath. Exercises 7 days a week and plays competitive soccer. Hyperlipidemia. ACL surgery twice.  family history reveals mother living with diabetes and hypertension. Father died of complications of dementia excised as well as reveals he has a brother who is a Development worker, international aid in [place].  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 Married with 2 sons. Plays competitive soccer and also coaches. Denies alcohol or tobacco.      We spent a long time talking about his hernia. He has a reducible right inguinal hernia. I went over techniques of repair with mesh, laparoscopic techniques. Open repair. Gave him his choice. He chose open repair. He will be scheduled for open repair of right inguinal hernia with mesh as outpatient in the near future. I discussed the indications, details, techniques, and numerous risk of the surgery. He is aware the risk of bleeding, infection, recurrence, injury to the testicle bowel or bladder,  chronic pain and other unforeseen problems. He understands all these issues. All of his questions are answered. He agrees with this plan. He knows that he cannot play competitive soccer for 1 month. We talked about restrictions of activities for a long times since he is an avid exerciser  Proceed with surgical scheduling after we get cardiac clearance with Dr. [place].    Past Surgical History  Knee Surgery  Left.  Diagnostic Studies History  Colonoscopy  1-5 years ago  Allergies No Known Drug Allergies  Allergies Reconciled   Medication History  Rosuvastatin Calcium (40MG  Tablet, Oral) Active. Aspirin (81MG  Tablet, Oral) Active. Medications Reconciled  Social History  Alcohol use  Remotely quit alcohol use. Caffeine use  Coffee, Tea. No drug use  Tobacco use  Never smoker.  Family History  Diabetes Mellitus  Brother, Mother. Heart Disease  Brother, Father, Mother. Heart disease in male family member before age 7  Heart disease in male family member before age 13  Hypertension  Father, Mother.  Other Problems  Gastroesophageal Reflux Disease  Myocardial infarction  Other disease, cancer, significant illness     Review of Systems  General Not Present- Appetite Loss, Chills, Fatigue, Fever, Night Sweats, Weight Gain and Weight Loss. Skin Not Present- Change in Wart/Mole, Dryness, Hives, Jaundice, New Lesions, Non-Healing Wounds, Rash and Ulcer. HEENT Present- Seasonal Allergies and Wears glasses/contact lenses. Not Present- Earache, Hearing Loss, Hoarseness, Nose Bleed, Oral Ulcers, Ringing in the Ears, Sinus Pain, Sore Throat, Visual Disturbances and Yellow Eyes. Respiratory Present-  Snoring. Not Present- Bloody sputum, Chronic Cough, Difficulty Breathing and Wheezing. Breast Not Present- Breast Mass, Breast Pain, Nipple Discharge and Skin Changes. Cardiovascular Not Present- Chest Pain, Difficulty Breathing Lying Down, Leg Cramps, Palpitations,  Rapid Heart Rate, Shortness of Breath and Swelling of Extremities. Gastrointestinal Not Present- Abdominal Pain, Bloating, Bloody Stool, Change in Bowel Habits, Chronic diarrhea, Constipation, Difficulty Swallowing, Excessive gas, Gets full quickly at meals, Hemorrhoids, Indigestion, Nausea, Rectal Pain and Vomiting. Male Genitourinary Present- Nocturia. Not Present- Blood in Urine, Change in Urinary Stream, Frequency, Impotence, Painful Urination, Urgency and Urine Leakage. Musculoskeletal Not Present- Back Pain, Joint Pain, Joint Stiffness, Muscle Pain, Muscle Weakness and Swelling of Extremities. Neurological Not Present- Decreased Memory, Fainting, Headaches, Numbness, Seizures, Tingling, Tremor, Trouble walking and Weakness. Psychiatric Not Present- Anxiety, Bipolar, Change in Sleep Pattern, Depression, Fearful and Frequent crying. Endocrine Not Present- Cold Intolerance, Excessive Hunger, Hair Changes, Heat Intolerance, Hot flashes and New Diabetes. Hematology Present- Blood Thinners. Not Present- Easy Bruising, Excessive bleeding, Gland problems, HIV and Persistent Infections.  Vitals  Weight: 155.2 lb Height: 66in Body Surface Area: 1.8 m Body Mass Index: 25.05 kg/m  Temp.: 97.53F  Pulse: 84 (Regular)  BP: 116/72 (Sitting, Left Arm, Standard)    Physical Exam General Mental Status-Alert. General Appearance-Consistent with stated age. Hydration-Well hydrated. Voice-Normal. Note: Pleasant. Very talkative. Intelligent with good insight. BMI 25. Looks fit.   Head and Neck Head-normocephalic, atraumatic with no lesions or palpable masses. Trachea-midline. Thyroid Gland Characteristics - normal size and consistency.  Eye Eyeball - Bilateral-Extraocular movements intact. Sclera/Conjunctiva - Bilateral-No scleral icterus.  Chest and Lung Exam Chest and lung exam reveals -quiet, even and easy respiratory effort with no use of accessory muscles and  on auscultation, normal breath sounds, no adventitious sounds and normal vocal resonance. Inspection Chest Wall - Normal. Back - normal.  Breast Breast - Left-Symmetric, Non Tender, No Biopsy scars, no Dimpling, No Inflammation, No Lumpectomy scars, No Mastectomy scars, No Peau d' Orange. Breast - Right-Symmetric, Non Tender, No Biopsy scars, no Dimpling, No Inflammation, No Lumpectomy scars, No Mastectomy scars, No Peau d' Orange. Breast Lump-No Palpable Breast Mass.  Cardiovascular Cardiovascular examination reveals -normal heart sounds, regular rate and rhythm with no murmurs and normal pedal pulses bilaterally.  Abdomen Inspection Inspection of the abdomen reveals - No Hernias. Skin - Scar - no surgical scars. Palpation/Percussion Palpation and Percussion of the abdomen reveal - Soft, Non Tender, No Rebound tenderness, No Rigidity (guarding) and No hepatosplenomegaly. Auscultation Auscultation of the abdomen reveals - Bowel sounds normal.  Male Genitourinary Note: Reducible right inguinal hernia. About the size of a small egg. Does not go into the scrotum. No evidence of hernia on the left side. No scrotal mass. Umbilicus normal.   Neurologic Neurologic evaluation reveals -alert and oriented x 3 with no impairment of recent or remote memory. Mental Status-Normal.  Musculoskeletal Normal Exam - Left-Upper Extremity Strength Normal and Lower Extremity Strength Normal. Normal Exam - Right-Upper Extremity Strength Normal and Lower Extremity Strength Normal.  Lymphatic Head & Neck  General Head & Neck Lymphatics: Bilateral - Description - Normal. Axillary  General Axillary Region: Bilateral - Description - Normal. Tenderness - Non Tender. Femoral & Inguinal  Generalized Femoral & Inguinal Lymphatics: Bilateral - Description - Normal. Tenderness - Non Tender.    Assessment & Plan  RIGHT INGUINAL HERNIA (K40.90)   You have a reducible right inguinal  hernia We have discussed the different techniques for repair with mesh We have discussed the indications, techniques,  and risks of the surgery in detail  You have decided to go ahead and schedule repair of your right inguinal hernia with mesh in the near future you will be scheduled for open repair of right inguinal hernia with mesh  CORONARY ARTERY DISEASE, OCCLUSIVE (I25.10) HISTORY OF PTCA (Z98.61) HYPERLIPIDEMIA, ACQUIRED (E78.5)    Jessyca Sloan M. Derrell LollingIngram, M.D., [facility] General and Minimally invasive Surgery Breast and Colorectal Surgery Office:   [phone] Pager:   [phone]

## 2017-11-17 ENCOUNTER — Encounter (HOSPITAL_BASED_OUTPATIENT_CLINIC_OR_DEPARTMENT_OTHER): Payer: Self-pay | Admitting: Anesthesiology

## 2017-11-17 ENCOUNTER — Ambulatory Visit (HOSPITAL_BASED_OUTPATIENT_CLINIC_OR_DEPARTMENT_OTHER)
Admission: RE | Admit: 2017-11-17 | Discharge: 2017-11-17 | Disposition: A | Discharge status: Home / Self Care | Payer: 59 | Source: Ambulatory Visit | Attending: General Surgery | Admitting: General Surgery

## 2017-11-17 ENCOUNTER — Ambulatory Visit (HOSPITAL_BASED_OUTPATIENT_CLINIC_OR_DEPARTMENT_OTHER): Payer: 59 | Admitting: Certified Registered"

## 2017-11-17 ENCOUNTER — Other Ambulatory Visit: Payer: Self-pay

## 2017-11-17 ENCOUNTER — Encounter (HOSPITAL_BASED_OUTPATIENT_CLINIC_OR_DEPARTMENT_OTHER)
Admission: RE | Disposition: A | Discharge status: Home / Self Care | Payer: Self-pay | Source: Ambulatory Visit | Attending: General Surgery

## 2017-11-17 DIAGNOSIS — Z9861 Coronary angioplasty status: Secondary | ICD-10-CM | POA: Insufficient documentation

## 2017-11-17 DIAGNOSIS — K4090 Unilateral inguinal hernia, without obstruction or gangrene, not specified as recurrent: Secondary | ICD-10-CM

## 2017-11-17 DIAGNOSIS — E785 Hyperlipidemia, unspecified: Secondary | ICD-10-CM | POA: Insufficient documentation

## 2017-11-17 DIAGNOSIS — I252 Old myocardial infarction: Secondary | ICD-10-CM | POA: Diagnosis not present

## 2017-11-17 DIAGNOSIS — K219 Gastro-esophageal reflux disease without esophagitis: Secondary | ICD-10-CM | POA: Insufficient documentation

## 2017-11-17 DIAGNOSIS — I2510 Atherosclerotic heart disease of native coronary artery without angina pectoris: Secondary | ICD-10-CM | POA: Insufficient documentation

## 2017-11-17 DIAGNOSIS — G8918 Other acute postprocedural pain: Secondary | ICD-10-CM | POA: Diagnosis not present

## 2017-11-17 HISTORY — DX: Unilateral inguinal hernia, without obstruction or gangrene, not specified as recurrent: K40.90

## 2017-11-17 HISTORY — PX: INSERTION OF MESH: SHX5868

## 2017-11-17 HISTORY — PX: INGUINAL HERNIA REPAIR: SHX194

## 2017-11-17 LAB — POCT I-STAT, CHEM 8
BUN: 9 mg/dL (ref 6–20)
CALCIUM ION: 1.14 mmol/L — AB (ref 1.15–1.40)
CREATININE: 0.7 mg/dL (ref 0.61–1.24)
Chloride: 100 mmol/L (ref 98–111)
Glucose, Bld: 83 mg/dL (ref 70–99)
HEMATOCRIT: 37 % — AB (ref 39.0–52.0)
HEMOGLOBIN: 12.6 g/dL — AB (ref 13.0–17.0)
Potassium: 3.8 mmol/L (ref 3.5–5.1)
Sodium: 139 mmol/L (ref 135–145)
TCO2: 27 mmol/L (ref 22–32)

## 2017-11-17 SURGERY — REPAIR, HERNIA, INGUINAL, ADULT
Anesthesia: General | Site: Groin | Laterality: Right

## 2017-11-17 MED ORDER — ACETAMINOPHEN 500 MG PO TABS
ORAL_TABLET | ORAL | Status: AC
  Filled 2017-11-17: qty 2

## 2017-11-17 MED ORDER — ACETAMINOPHEN 325 MG PO TABS: 325.0000 mg | ORAL_TABLET | ORAL | Status: DC | PRN

## 2017-11-17 MED ORDER — BUPIVACAINE-EPINEPHRINE 0.5% -1:200000 IJ SOLN
INTRAMUSCULAR | Status: DC | PRN
  Administered 2017-11-17: 10 mL

## 2017-11-17 MED ORDER — SUCCINYLCHOLINE CHLORIDE 200 MG/10ML IV SOSY
PREFILLED_SYRINGE | INTRAVENOUS | Status: AC
  Filled 2017-11-17: qty 10

## 2017-11-17 MED ORDER — BUPIVACAINE LIPOSOME 1.3 % IJ SUSP: 20.0000 mL | Freq: Once | INTRAMUSCULAR | Status: DC

## 2017-11-17 MED ORDER — CELECOXIB 200 MG PO CAPS
200.0000 mg | ORAL_CAPSULE | ORAL | Status: AC
  Administered 2017-11-17: 200 mg via ORAL

## 2017-11-17 MED ORDER — ACETAMINOPHEN 160 MG/5ML PO SOLN: 325.0000 mg | ORAL | Status: DC | PRN

## 2017-11-17 MED ORDER — PHENYLEPHRINE HCL 10 MG/ML IJ SOLN
INTRAMUSCULAR | Status: DC | PRN
  Administered 2017-11-17 (×2): 80 ug via INTRAVENOUS

## 2017-11-17 MED ORDER — 0.9 % SODIUM CHLORIDE (POUR BTL) OPTIME
TOPICAL | Status: DC | PRN
  Administered 2017-11-17: 200 mL

## 2017-11-17 MED ORDER — GABAPENTIN 300 MG PO CAPS
ORAL_CAPSULE | ORAL | Status: AC
  Filled 2017-11-17: qty 1

## 2017-11-17 MED ORDER — SODIUM CHLORIDE 0.9 % IJ SOLN
INTRAMUSCULAR | Status: AC
  Filled 2017-11-17: qty 30

## 2017-11-17 MED ORDER — GABAPENTIN 300 MG PO CAPS
300.0000 mg | ORAL_CAPSULE | ORAL | Status: AC
  Administered 2017-11-17: 300 mg via ORAL

## 2017-11-17 MED ORDER — LACTATED RINGERS IV SOLN
INTRAVENOUS | Status: DC
  Administered 2017-11-17 (×2): via INTRAVENOUS

## 2017-11-17 MED ORDER — OXYCODONE HCL 5 MG PO TABS
5.0000 mg | ORAL_TABLET | Freq: Once | ORAL | Status: AC | PRN
  Administered 2017-11-17: 5 mg via ORAL

## 2017-11-17 MED ORDER — SUGAMMADEX SODIUM 200 MG/2ML IV SOLN
INTRAVENOUS | Status: AC
  Filled 2017-11-17: qty 2

## 2017-11-17 MED ORDER — OXYCODONE HCL 5 MG PO TABS
ORAL_TABLET | ORAL | Status: AC
  Filled 2017-11-17: qty 1

## 2017-11-17 MED ORDER — BUPIVACAINE LIPOSOME 1.3 % IJ SUSP
INTRAMUSCULAR | Status: DC | PRN
  Administered 2017-11-17: 10 mL via PERINEURAL

## 2017-11-17 MED ORDER — ONDANSETRON HCL 4 MG/2ML IJ SOLN
INTRAMUSCULAR | Status: DC | PRN
  Administered 2017-11-17: 4 mg via INTRAVENOUS

## 2017-11-17 MED ORDER — MIDAZOLAM HCL 2 MG/2ML IJ SOLN
INTRAMUSCULAR | Status: AC
  Filled 2017-11-17: qty 2

## 2017-11-17 MED ORDER — FENTANYL CITRATE (PF) 100 MCG/2ML IJ SOLN
INTRAMUSCULAR | Status: AC
  Filled 2017-11-17: qty 2

## 2017-11-17 MED ORDER — ROCURONIUM BROMIDE 10 MG/ML (PF) SYRINGE
PREFILLED_SYRINGE | INTRAVENOUS | Status: AC
  Filled 2017-11-17: qty 10

## 2017-11-17 MED ORDER — FENTANYL CITRATE (PF) 100 MCG/2ML IJ SOLN
INTRAMUSCULAR | Status: DC | PRN
  Administered 2017-11-17: 50 ug via INTRAVENOUS

## 2017-11-17 MED ORDER — ROCURONIUM BROMIDE 100 MG/10ML IV SOLN
INTRAVENOUS | Status: DC | PRN
  Administered 2017-11-17: 40 mg via INTRAVENOUS

## 2017-11-17 MED ORDER — SCOPOLAMINE 1 MG/3DAYS TD PT72: 1.0000 | MEDICATED_PATCH | Freq: Once | TRANSDERMAL | Status: DC | PRN

## 2017-11-17 MED ORDER — DEXAMETHASONE SODIUM PHOSPHATE 4 MG/ML IJ SOLN
INTRAMUSCULAR | Status: DC | PRN
  Administered 2017-11-17: 10 mg via INTRAVENOUS

## 2017-11-17 MED ORDER — ONDANSETRON HCL 4 MG/2ML IJ SOLN: 4.0000 mg | Freq: Once | INTRAMUSCULAR | Status: DC | PRN

## 2017-11-17 MED ORDER — PROPOFOL 10 MG/ML IV BOLUS
INTRAVENOUS | Status: DC | PRN
  Administered 2017-11-17: 200 mg via INTRAVENOUS

## 2017-11-17 MED ORDER — HYDROCODONE-ACETAMINOPHEN 5-325 MG PO TABS: 1.0000 | ORAL_TABLET | Freq: Four times a day (QID) | ORAL | 0 refills | Status: DC | PRN

## 2017-11-17 MED ORDER — CHLORHEXIDINE GLUCONATE CLOTH 2 % EX PADS: 6.0000 | MEDICATED_PAD | Freq: Once | CUTANEOUS | Status: DC

## 2017-11-17 MED ORDER — ACETAMINOPHEN 500 MG PO TABS
1000.0000 mg | ORAL_TABLET | ORAL | Status: AC
  Administered 2017-11-17: 1000 mg via ORAL

## 2017-11-17 MED ORDER — LIDOCAINE HCL (CARDIAC) PF 100 MG/5ML IV SOSY
PREFILLED_SYRINGE | INTRAVENOUS | Status: DC | PRN
  Administered 2017-11-17: 20 mg via INTRAVENOUS

## 2017-11-17 MED ORDER — FENTANYL CITRATE (PF) 100 MCG/2ML IJ SOLN
50.0000 ug | INTRAMUSCULAR | Status: DC | PRN
  Administered 2017-11-17: 100 ug via INTRAVENOUS

## 2017-11-17 MED ORDER — CEFAZOLIN SODIUM-DEXTROSE 2-4 GM/100ML-% IV SOLN
2.0000 g | INTRAVENOUS | Status: AC
  Administered 2017-11-17 (×2): 2 g via INTRAVENOUS

## 2017-11-17 MED ORDER — CELECOXIB 200 MG PO CAPS
ORAL_CAPSULE | ORAL | Status: AC
  Filled 2017-11-17: qty 1

## 2017-11-17 MED ORDER — MEPERIDINE HCL 25 MG/ML IJ SOLN: 6.2500 mg | INTRAMUSCULAR | Status: DC | PRN

## 2017-11-17 MED ORDER — FENTANYL CITRATE (PF) 100 MCG/2ML IJ SOLN: 25.0000 ug | INTRAMUSCULAR | Status: DC | PRN

## 2017-11-17 MED ORDER — BUPIVACAINE-EPINEPHRINE (PF) 0.5% -1:200000 IJ SOLN
INTRAMUSCULAR | Status: AC
  Filled 2017-11-17: qty 30

## 2017-11-17 MED ORDER — MIDAZOLAM HCL 2 MG/2ML IJ SOLN
1.0000 mg | INTRAMUSCULAR | Status: DC | PRN
  Administered 2017-11-17: 2 mg via INTRAVENOUS

## 2017-11-17 MED ORDER — BUPIVACAINE HCL (PF) 0.5 % IJ SOLN
INTRAMUSCULAR | Status: DC | PRN
  Administered 2017-11-17: 20 mL via PERINEURAL

## 2017-11-17 MED ORDER — OXYCODONE HCL 5 MG/5ML PO SOLN: 5.0000 mg | Freq: Once | ORAL | Status: AC | PRN

## 2017-11-17 MED ORDER — SUGAMMADEX SODIUM 200 MG/2ML IV SOLN
INTRAVENOUS | Status: DC | PRN
  Administered 2017-11-17: 200 mg via INTRAVENOUS

## 2017-11-17 MED ORDER — LIDOCAINE HCL (CARDIAC) PF 100 MG/5ML IV SOSY
PREFILLED_SYRINGE | INTRAVENOUS | Status: AC
  Filled 2017-11-17: qty 5

## 2017-11-17 SURGICAL SUPPLY — 62 items
BENZOIN TINCTURE PRP APPL 2/3 (GAUZE/BANDAGES/DRESSINGS) IMPLANT
BLADE CLIPPER SURG (BLADE) ×3 IMPLANT
BLADE HEX COATED 2.75 (ELECTRODE) ×3 IMPLANT
BLADE SURG 10 STRL SS (BLADE) ×3 IMPLANT
CANISTER SUCT 1200ML W/VALVE (MISCELLANEOUS) ×3 IMPLANT
CHLORAPREP W/TINT 26ML (MISCELLANEOUS) ×3 IMPLANT
CLOSURE WOUND 1/2 X4 (GAUZE/BANDAGES/DRESSINGS)
COVER BACK TABLE 60X90IN (DRAPES) ×3 IMPLANT
COVER MAYO STAND STRL (DRAPES) ×3 IMPLANT
DECANTER SPIKE VIAL GLASS SM (MISCELLANEOUS) IMPLANT
DERMABOND ADVANCED (GAUZE/BANDAGES/DRESSINGS) ×2
DERMABOND ADVANCED .7 DNX12 (GAUZE/BANDAGES/DRESSINGS) ×1 IMPLANT
DRAIN PENROSE 1/2X12 LTX STRL (WOUND CARE) ×3 IMPLANT
DRAPE LAPAROTOMY 100X72 PEDS (DRAPES) ×3 IMPLANT
DRAPE LAPAROTOMY TRNSV 102X78 (DRAPE) IMPLANT
DRAPE UTILITY XL STRL (DRAPES) ×3 IMPLANT
ELECT REM PT RETURN 9FT ADLT (ELECTROSURGICAL) ×3
ELECTRODE REM PT RTRN 9FT ADLT (ELECTROSURGICAL) ×1 IMPLANT
GAUZE SPONGE 4X4 12PLY STRL LF (GAUZE/BANDAGES/DRESSINGS) IMPLANT
GLOVE BIO SURGEON STRL SZ 6.5 (GLOVE) ×2 IMPLANT
GLOVE BIO SURGEONS STRL SZ 6.5 (GLOVE) ×1
GLOVE EUDERMIC 7 POWDERFREE (GLOVE) ×3 IMPLANT
GLOVE EXAM NITRILE MD LF STRL (GLOVE) ×3 IMPLANT
GOWN STRL REUS W/ TWL LRG LVL3 (GOWN DISPOSABLE) ×1 IMPLANT
GOWN STRL REUS W/ TWL XL LVL3 (GOWN DISPOSABLE) ×1 IMPLANT
GOWN STRL REUS W/TWL LRG LVL3 (GOWN DISPOSABLE) ×2
GOWN STRL REUS W/TWL XL LVL3 (GOWN DISPOSABLE) ×2
MESH ULTRAPRO 3X6 7.6X15CM (Mesh General) ×3 IMPLANT
NEEDLE HYPO 22GX1.5 SAFETY (NEEDLE) IMPLANT
NEEDLE HYPO 25X1 1.5 SAFETY (NEEDLE) ×3 IMPLANT
NS IRRIG 1000ML POUR BTL (IV SOLUTION) ×3 IMPLANT
PACK BASIN DAY SURGERY FS (CUSTOM PROCEDURE TRAY) ×3 IMPLANT
PENCIL BUTTON HOLSTER BLD 10FT (ELECTRODE) ×3 IMPLANT
SLEEVE SCD COMPRESS KNEE MED (MISCELLANEOUS) ×3 IMPLANT
SPONGE LAP 4X18 RFD (DISPOSABLE) ×3 IMPLANT
STAPLER VISISTAT 35W (STAPLE) IMPLANT
STRIP CLOSURE SKIN 1/2X4 (GAUZE/BANDAGES/DRESSINGS) IMPLANT
SUT MNCRL AB 4-0 PS2 18 (SUTURE) ×3 IMPLANT
SUT PROLENE 1 CT (SUTURE) IMPLANT
SUT PROLENE 2 0 CT2 30 (SUTURE) ×9 IMPLANT
SUT SILK 2 0 SH (SUTURE) ×3 IMPLANT
SUT SILK 2 0 TIES 17X18 (SUTURE) ×2
SUT SILK 2-0 18XBRD TIE BLK (SUTURE) ×1 IMPLANT
SUT SILK 3 0 SH 30 (SUTURE) IMPLANT
SUT VIC AB 2-0 CT1 27 (SUTURE)
SUT VIC AB 2-0 CT1 TAPERPNT 27 (SUTURE) IMPLANT
SUT VIC AB 2-0 SH 27 (SUTURE) ×4
SUT VIC AB 2-0 SH 27XBRD (SUTURE) ×2 IMPLANT
SUT VIC AB 3-0 54X BRD REEL (SUTURE) IMPLANT
SUT VIC AB 3-0 BRD 54 (SUTURE)
SUT VIC AB 3-0 FS2 27 (SUTURE) IMPLANT
SUT VIC AB 3-0 SH 27 (SUTURE) ×2
SUT VIC AB 3-0 SH 27X BRD (SUTURE) ×1 IMPLANT
SUT VICRYL 3-0 CR8 SH (SUTURE) IMPLANT
SYR 10ML LL (SYRINGE) ×3 IMPLANT
SYR 20CC LL (SYRINGE) IMPLANT
SYR BULB 3OZ (MISCELLANEOUS) ×3 IMPLANT
TOWEL OR NON WOVEN STRL DISP B (DISPOSABLE) ×3 IMPLANT
TRAY DSU PREP LF (CUSTOM PROCEDURE TRAY) ×3 IMPLANT
TUBE CONNECTING 20'X1/4 (TUBING) ×1
TUBE CONNECTING 20X1/4 (TUBING) ×2 IMPLANT
YANKAUER SUCT BULB TIP NO VENT (SUCTIONS) ×3 IMPLANT

## 2017-11-17 NOTE — Anesthesia Preprocedure Evaluation (Signed)
Anesthesia Evaluation  Patient identified by MRN, date of birth, ID band Patient awake    Reviewed: Allergy & Precautions, H&P , NPO status , Patient's Chart, lab work & pertinent test results, reviewed documented beta blocker date and time   Airway Mallampati: II  TM Distance: >3 FB Neck ROM: full    Dental no notable dental hx.    Pulmonary    Pulmonary exam normal breath sounds clear to auscultation       Cardiovascular Exercise Tolerance: Good + CAD and + Past MI   Rhythm:regular Rate:Normal     Neuro/Psych    GI/Hepatic GERD  Medicated,  Endo/Other    Renal/GU   negative genitourinary   Musculoskeletal   Abdominal   Peds  Hematology   Anesthesia Other Findings   Reproductive/Obstetrics                             Anesthesia Physical Anesthesia Plan  ASA: II  Anesthesia Plan: General   Post-op Pain Management: GA combined w/ Regional for post-op pain   Induction: Intravenous  PONV Risk Score and Plan: 2 and Ondansetron, Dexamethasone and Treatment may vary due to age or medical condition  Airway Management Planned: Oral ETT and LMA  Additional Equipment:   Intra-op Plan:   Post-operative Plan: Extubation in OR  Informed Consent: I have reviewed the patients History and Physical, chart, labs and discussed the procedure including the risks, benefits and alternatives for the proposed anesthesia with the patient or authorized representative who has indicated his/her understanding and acceptance.   Dental Advisory Given  Plan Discussed with: CRNA, Anesthesiologist and Surgeon  Anesthesia Plan Comments: (  )        Anesthesia Quick Evaluation

## 2017-11-17 NOTE — Transfer of Care (Signed)
Immediate Anesthesia Transfer of Care Note  Patient: Bob Adams  Procedure(s) Performed: OPEN RIGHT INGUINAL HERNIA REPAIR (Right Groin) INSERTION OF MESH (Right Groin)  Patient Location: PACU  Anesthesia Type:GA combined with regional for post-op pain  Level of Consciousness: awake and patient cooperative  Airway & Oxygen Therapy: Patient Spontanous Breathing and Patient connected to face mask oxygen  Post-op Assessment: Report given to RN and Post -op Vital signs reviewed and stable  Post vital signs: Reviewed and stable  Last Vitals:  Vitals Value Taken Time  BP    Temp    Pulse 96 11/17/2017 12:41 PM  Resp 18 11/17/2017 12:41 PM  SpO2 100 % 11/17/2017 12:41 PM  Vitals shown include unvalidated device data.  Last Pain:  Vitals:   11/17/17 0948  TempSrc: Oral  PainSc: 0-No pain         Complications: No apparent anesthesia complications

## 2017-11-17 NOTE — Discharge Instructions (Signed)
[facility] _______[facility]  UMBILICAL OR INGUINAL HERNIA REPAIR: POST OP INSTRUCTIONS  Always review your discharge instruction sheet given to you by the facility where your surgery was performed. IF YOU HAVE DISABILITY OR FAMILY LEAVE FORMS, YOU MUST BRING THEM TO THE OFFICE FOR PROCESSING.   DO NOT GIVE THEM TO YOUR DOCTOR.  1. A  prescription for pain medication may be given to you upon discharge.  Take your pain medication as prescribed, if needed.  If narcotic pain medicine is not needed, then you may take acetaminophen (Tylenol) or ibuprofen (Advil) as needed. No Tylenol until 4:00pm! No ibuprofen until 6:00pm! 2. Take your usually prescribed medications unless otherwise directed. If you need a refill on your pain medication, please contact your pharmacy.  They will contact our office to request authorization. Prescriptions will not be filled after 5 pm or on week-ends. 3. You should follow a light diet the first 24 hours after arrival home, such as soup and crackers, etc.  Be sure to include lots of fluids daily.  Resume your normal diet the day after surgery. 4.Most patients will experience some swelling and bruising around the umbilicus or in the groin and scrotum.  Ice packs and reclining will help.  Swelling and bruising can take several days to resolve.  6. It is common to experience some constipation if taking pain medication after surgery.  Increasing fluid intake and taking a stool softener (such as Colace) will usually help or prevent this problem from occurring.  A mild laxative (Milk of Magnesia or Miralax) should be taken according to package directions if there are no bowel movements after 48 hours. 7. Unless discharge instructions indicate otherwise, you may remove your bandages 24-48 hours after surgery, and you may shower at that time.  You may have steri-strips (small skin tapes) in place directly over the incision.  These strips should be left on the skin for 7-10  days.  If your surgeon used skin glue on the incision, you may shower in 24 hours.  The glue will flake off over the next 2-3 weeks.  Any sutures or staples will be removed at the office during your follow-up visit. 8. ACTIVITIES:  You may resume regular (light) daily activities beginning the next day--such as daily self-care, walking, climbing stairs--gradually increasing activities as tolerated.  You may have sexual intercourse when it is comfortable.  Refrain from any heavy lifting or straining until approved by your doctor.  a.You may drive when you are no longer taking prescription pain medication, you can comfortably wear a seatbelt, and you can safely maneuver your car and apply brakes. b.RETURN TO WORK:   _____________________________________________  9.You should see your doctor in the office for a follow-up appointment approximately 2-3 weeks after your surgery.  Make sure that you call for this appointment within a day or two after you arrive home to insure a convenient appointment time. 10.OTHER INSTRUCTIONS: _________________________    _____________________________________  WHEN TO CALL YOUR DOCTOR: 1. Fever over 101.0 2. Inability to urinate 3. Nausea and/or vomiting 4. Extreme swelling or bruising 5. Continued bleeding from incision. 6. Increased pain, redness, or drainage from the incision  The clinic staff is available to answer your questions during regular business hours.  Please dont hesitate to call and ask to speak to one of the nurses for clinical concerns.  If you have a medical emergency, go to the nearest emergency room or call 911.  A surgeon from [facility] is  always on call at the hospital   [address], [address], [place], [place]  [address] ?  [address] [address], [place], [place]   [address] [phone] ? [phone] ? FAX [phone] Web site: [email or web address]    Post Anesthesia Home Care Instructions  Activity: Get  plenty of rest for the remainder of the day. A responsible individual must stay with you for 24 hours following the procedure.  For the next 24 hours, DO NOT: -Drive a car -Advertising copywriterperate machinery -Drink alcoholic beverages -Take any medication unless instructed by your physician -Make any legal decisions or sign important papers.  Meals: Start with liquid foods such as gelatin or soup. Progress to regular foods as tolerated. Avoid greasy, spicy, heavy foods. If nausea and/or vomiting occur, drink only clear liquids until the nausea and/or vomiting subsides. Call your physician if vomiting continues.  Special Instructions/Symptoms: Your throat may feel dry or sore from the anesthesia or the breathing tube placed in your throat during surgery. If this causes discomfort, gargle with warm salt water. The discomfort should disappear within 24 hours.  If you had a scopolamine patch placed behind your ear for the management of post- operative nausea and/or vomiting:  1. The medication in the patch is effective for 72 hours, after which it should be removed.  Wrap patch in a tissue and discard in the trash. Wash hands thoroughly with soap and water. 2. You may remove the patch earlier than 72 hours if you experience unpleasant side effects which may include dry mouth, dizziness or visual disturbances. 3. Avoid touching the patch. Wash your hands with soap and water after contact with the patch.

## 2017-11-17 NOTE — Op Note (Signed)
Patient Name:           Bob Adams   Date of Surgery:        11/17/2017  Pre op Diagnosis:      Right inguinal hernia  Post op Diagnosis:    Direct right inguinal hernia  Procedure:                 Open repair right inguinal hernia with mesh Bob Adams repair)  Surgeon:                     Bob Mould. Derrell Lolling, M.D., FACS  Assistant:                      OR staff  Operative Indications:   This is a pleasant 57 year old gentleman, referred by Bob Adams for evaluation of right inguinal hernia. Bob Adams is his cardiologist.     He thinks he was evaluated for right groin pain 3 years ago.  and nothing was found. He's noticed a bulge the past 2 or 3 months. Always reducible. No history of incarceration. No prior history of hernia surgery. BobShaw diagnosed a right inguinal hernia. He is referred for surgical opinion and elective repair      Past history reveals cardiac catheterization and PTCA in 2010. He takes aspirin and statins. No chest pain or shortness of breath. Exercises 7 days a week and plays competitive soccer. Hyperlipidemia. ACL surgery twice.       We spent a long time talking about his hernia. He has a reducible right inguinal hernia. I went over techniques of repair with mesh, laparoscopic techniques. Open repair. Gave him his choice. He chose open repair. He will be scheduled for open repair of right inguinal hernia with mesh as outpatient in the near future.    Operative Findings:       He had a direct right inguinal hernia.  There was no evidence of indirect hernia.  Careful examination of the cord structures revealed the edge of the peritoneum up inside the internal ring.  Following the induction of general endotracheal anesthesia  Procedure in Detail:          The patient's lower abdomen and genitalia were prepped and draped in a sterile fashion.  A TA PP block had been performed in the holding area by anesthesia.  Intravenous  antibiotics were given.  Surgical timeout was performed.  0.5% Marcaine with epinephrine was used as an infiltration anesthetic for the skin and subcutaneous tissue.     A transverse incision was made in the right inguinal area.  Dissection was carried down to the external oblique.  The external oblique was incised in the direction of its fibers opening up the external inguinal ring.  The external oblique was dissected away from the underlying structures and self-retaining retractors were placed.  The cord structures were mobilized and encircled with a Penrose drain.  Small sensory nerve was deeply incorporated into the cord structures and I traced this nerve back to its emergence from the muscles laterally, clamped it divided and ligated it with 2-0 silk tie.  The redundant nerve medially was resected.  I found the direct hernia bulge medial to the internal ring.  This was oversewn with a running suture of 2-0 Vicryl.  The internal ring and cord structures were examined and there was no indirect hernia.  The floor of the inguinal canal was repaired and reinforced with an onlay graft  of ultra pro mesh.  A 3 inch x 6 inch piece of mesh was brought to the operative field and trimmed at the corners to accommodate his anatomy.  The mesh was sutured in place with running sutures of 2-0 Prolene and interrupted mattress sutures of 2-0 Prolene.  The mesh was sutured so as to generously overlap the fascia at the pubic tubercle, then along the inguinal ligament inferiorly.  Several interrupted sutures were placed medially superiorly and superior laterally.  The mesh was incised laterally so as to wrap around the cord structures at the internal ring.  The tails of the mesh were overlapped laterally and further sutures were placed laterally.  This provided a very secure coverage and repair both medial and lateral to the internal ring but allowed an adequate fingertip opening for the cord structures.  Hemostasis was excellent.   The wound was irrigated.  External oblique was closed with a running suture of 2-0 Vicryl.  Scarpa's fascia was closed with 3-0 Vicryl sutures and the skin closed with a running subcuticular 4-0 Monocryl and Dermabond.  The patient tolerated the procedure well and was taken to PACU in stable condition.  EBL 10 cc.  Counts correct.  Complications none.     Addendum: I logged onto the [employer] website and reviewed his prescription medication history.      Bob Adams, M.D., FACS General and Minimally Invasive Surgery Breast and Colorectal Surgery  11/17/2017 12:41 PM

## 2017-11-17 NOTE — Anesthesia Procedure Notes (Signed)
Anesthesia Regional Block: TAP block   Pre-Anesthetic Checklist: ,, timeout performed, Correct Patient, Correct Site, Correct Laterality, Correct Procedure, Correct Position, site marked, Risks and benefits discussed,  Surgical consent,  Pre-op evaluation,  At surgeon's request and post-op pain management  Laterality: Right  Prep: chloraprep       Needles:  Injection technique: Single-shot  Needle Type: Echogenic Stimulator Needle     Needle Length: 5cm  Needle Gauge: 22     Additional Needles:   Procedures:, nerve stimulator,,, ultrasound used (permanent image in chart),,,,  Narrative:  Start time: 11/17/2017 10:23 AM End time: 11/17/2017 10:26 AM Injection made incrementally with aspirations every 5 mL.  Performed by: Personally  Anesthesiologist: Bethena Midgetddono, Latrisha Coiro, MD  Additional Notes: Functioning IV was confirmed and monitors were applied.  A 50mm 22ga Arrow echogenic stimulator needle was used. Sterile prep and drape,hand hygiene and sterile gloves were used. Ultrasound guidance: relevant anatomy identified, needle position confirmed, local anesthetic spread visualized around nerve(s)., vascular puncture avoided.  Image printed for medical record. Negative aspiration and negative test dose prior to incremental administration of local anesthetic. The patient tolerated the procedure well.

## 2017-11-17 NOTE — Progress Notes (Signed)
Assisted Dr. Oddono with right, ultrasound guided, transabdominal plane block. Side rails up, monitors on throughout procedure. See vital signs in flow sheet. Tolerated Procedure well. 

## 2017-11-17 NOTE — Anesthesia Postprocedure Evaluation (Signed)
Anesthesia Post Note  Patient: Bob Adams  Procedure(s) Performed: OPEN RIGHT INGUINAL HERNIA REPAIR (Right Groin) INSERTION OF MESH (Right Groin)     Patient location during evaluation: PACU Anesthesia Type: General Level of consciousness: awake and alert Pain management: pain level controlled Vital Signs Assessment: post-procedure vital signs reviewed and stable Respiratory status: spontaneous breathing, nonlabored ventilation, respiratory function stable and patient connected to nasal cannula oxygen Cardiovascular status: blood pressure returned to baseline and stable Postop Assessment: no apparent nausea or vomiting Anesthetic complications: no    Last Vitals:  Vitals:   11/17/17 1300 11/17/17 1334  BP: 120/75 134/74  Pulse: 82 70  Resp: 17 16  Temp:  36.5 C  SpO2: 100% 99%    Last Pain:  Vitals:   11/17/17 1334  TempSrc: Oral  PainSc: 5                  Kennieth RadFitzgerald, Jerlean Peralta E

## 2017-11-17 NOTE — Interval H&P Note (Signed)
History and Physical Interval Note:  11/17/2017 9:19 AM  Bob Adams  has presented today for surgery, with the diagnosis of Right Inguinal hernia  The various methods of treatment have been discussed with the patient and family. After consideration of risks, benefits and other options for treatment, the patient has consented to  Procedure(s): OPEN RIGHT INGUINAL HERNIA REPAIR ERAS PATHWAY (Right) INSERTION OF MESH (Right) as a surgical intervention .  The patient's history has been reviewed, patient examined, no change in status, stable for surgery.  I have reviewed the patient's chart and labs.  Questions were answered to the patient's satisfaction.     [name]

## 2017-11-17 NOTE — Anesthesia Procedure Notes (Signed)
Procedure Name: Intubation Date/Time: 11/17/2017 11:41 AM Performed by: [place] DesanctisLinka, Deundra Bard L, CRNA Pre-anesthesia Checklist: Patient identified, Emergency Drugs available, Suction available and Patient being monitored Patient Re-evaluated:Patient Re-evaluated prior to induction Oxygen Delivery Method: Circle system utilized Preoxygenation: Pre-oxygenation with 100% oxygen Induction Type: IV induction Ventilation: Mask ventilation without difficulty Laryngoscope Size: Miller and 3 Tube type: Oral Tube size: 8.0 mm Number of attempts: 1 Airway Equipment and Method: Stylet and Oral airway Placement Confirmation: ETT inserted through vocal cords under direct vision,  positive ETCO2 and breath sounds checked- equal and bilateral Secured at: 23 cm Tube secured with: Tape Dental Injury: Teeth and Oropharynx as per pre-operative assessment

## 2017-11-18 ENCOUNTER — Encounter (HOSPITAL_BASED_OUTPATIENT_CLINIC_OR_DEPARTMENT_OTHER): Payer: Self-pay | Admitting: General Surgery

## 2017-12-10 DIAGNOSIS — J45998 Other asthma: Secondary | ICD-10-CM | POA: Diagnosis not present

## 2017-12-10 DIAGNOSIS — J0100 Acute maxillary sinusitis, unspecified: Secondary | ICD-10-CM | POA: Diagnosis not present

## 2017-12-10 DIAGNOSIS — J309 Allergic rhinitis, unspecified: Secondary | ICD-10-CM | POA: Diagnosis not present

## 2017-12-25 DIAGNOSIS — R05 Cough: Secondary | ICD-10-CM | POA: Diagnosis not present

## 2018-02-03 DIAGNOSIS — K4090 Unilateral inguinal hernia, without obstruction or gangrene, not specified as recurrent: Secondary | ICD-10-CM | POA: Diagnosis not present

## 2018-02-03 DIAGNOSIS — J111 Influenza due to unidentified influenza virus with other respiratory manifestations: Secondary | ICD-10-CM | POA: Diagnosis not present

## 2018-02-03 DIAGNOSIS — R05 Cough: Secondary | ICD-10-CM | POA: Diagnosis not present

## 2018-03-18 DIAGNOSIS — E7849 Other hyperlipidemia: Secondary | ICD-10-CM | POA: Diagnosis not present

## 2018-04-28 NOTE — Progress Notes (Signed)
Bob ShaveMustafa K Adams Date of Birth: 1961/05/14   History of Present Illness: Bob Adams is seen today for followup. He is status post stenting of the proximal LAD in 2010 with a drug-eluting stent. He also had 70% RCA disease and 50% LCx disease at that time.  Stress Echo in June 2011 was normal. ETT in Dec. 2017 was normal. On follow up today he is doing very well. He goes to the gym 5 days a week and exercises vigorously for 45 minutes. He did have a right inguinal hernia repair in July and it took a couple of months to recover.  Denies any chest pain, SOB, palpitations or any other complaints. Feels well. 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  Current Outpatient Medications on File Prior to Visit  Medication Sig Dispense Refill  . aspirin 81 MG tablet Take 81 mg by mouth daily.      . nitroGLYCERIN (NITROSTAT) 0.4 MG SL tablet Place 0.4 mg under the tongue every 5 (five) minutes as needed.      . rosuvastatin (CRESTOR) 40 MG tablet TAKE 1 TABLET DAILY 90 tablet 2   No current facility-administered medications on file prior to visit.     No Known Allergies  Past Medical History:  Diagnosis Date  . Allergy   . Coronary artery disease 2011   stent  . Dyslipidemia   . GERD (gastroesophageal reflux disease)   . Non Q wave myocardial infarction (HCC) 06/2009   stent  . Right inguinal hernia 11/17/2017    Past Surgical History:  Procedure Laterality Date  . CARDIOVASCULAR STRESS TEST  10/24/2009   EF 60%  . CORONARY STENT PLACEMENT  06/28/2008   SUCCESSFUL INTRACORONARY STENTING OF THE PROXIMAL LEFT ANTERIOR DESCENDING WITH THROMBUS EXTRACTION. EF 65%  . fracture arm    . INGUINAL HERNIA REPAIR Right 11/17/2017   Procedure: OPEN RIGHT INGUINAL HERNIA REPAIR;  Surgeon: Claud KelpIngram, Haywood, MD;  Location: [facility];  Service: General;  Laterality: Right;  . INSERTION OF MESH Right 11/17/2017   Procedure: INSERTION OF MESH;  Surgeon: Claud KelpIngram, Haywood, MD;  Location: [facility]  ;  Service: General;  Laterality: Right;  . KNEE ARTHROSCOPY      Social History   Tobacco Use  Smoking Status Never Smoker  Smokeless Tobacco Never Used    Social History   Substance and Sexual Activity  Alcohol Use No    Family History  Problem Relation Age of Onset  . Heart attack Mother   . Colon cancer Neg Hx     Review of Systems:  All other systems were reviewed and are negative.  Physical Exam: BP (!) 108/58   Pulse 81   Ht 5' 6.5" (1.689 m)   Wt 161 lb (73 kg)   BMI 25.60 kg/m  GENERAL:  Well appearing male in NAD HEENT:  PERRL, EOMI, sclera are clear. Oropharynx is clear. NECK:  No jugular venous distention, carotid upstroke brisk and symmetric, no bruits, no thyromegaly or adenopathy LUNGS:  Clear to auscultation bilaterally CHEST:  Unremarkable HEART:  RRR,  PMI not displaced or sustained,S1 and S2 within normal limits, no S3, no S4: no clicks, no rubs, no murmurs ABD:  Soft, nontender. BS +, no masses or bruits. No hepatomegaly, no splenomegaly EXT:  2 + pulses throughout, no edema, no cyanosis no clubbing SKIN:  Warm and dry.  No rashes NEURO:  Alert and oriented x 3. Cranial nerves II through XII intact. PSYCH:  Cognitively  intact      LABORATORY DATA:  Dated 06/09/15: cholesterol 117, triglycerides 77, LDL 71, HDL 31. A1c 4.5%. CMET, CBC, TSH normal Dated 06/14/16: cholesterol 117, triglycerides 65, HDL 41, LDL 63, A1c 4.3%. Hgb 12.3. Chemistries normal. Dated 06/20/17: cholesterol 101, triglycerides 64, HDL 37, LDL 51, plts 131K. T. Bili 2.6. Other chemistries and CBC normal. TSH normal.  Ecg today shows NSR with a normal Ecg. Rate 81.  I have personally reviewed and interpreted this study.  ETT 05/15/16:Study Highlights    There was no ST segment deviation noted during stress.   ETT with excellent exercise tolerance (14:01); no chest pain; normal BP response; no ST changes; negative adequate ETT.   Stress Findings    ECG Baseline ECG exhibits normal sinus rhythm..    Stress Findings The patient exercised following the Bruce protocol.  The patient reported no symptoms during the stress test. The patient experienced no angina during the stress test.   The test was stopped because the patient complained of fatigue.   Blood pressure and heart rate demonstrated a normal response to exercise. Overall, the patient's exercise capacity was excellent.   85% of maximum heart rate was achieved after 8 minutes. Recovery time: 15 minutes.  Duke Treadmill Score: intermediate risk The patient's response to exercise was adequate for diagnosis.    Response to Stress There was no ST segment deviation noted during stress.  Arrhythmias during stress: none.  Arrhythmias during recovery: none.  There were no significant arrhythmias noted during the test.  ECG was interpretable and there was no significant change from baseline.    Stress Measurements   Baseline Vitals  Rest HR 85 bpm    Rest BP 117/74 mmHg    Exercise Time  Exercise duration (min) 14 min    Exercise duration (sec) 1 sec    Peak Stress Vitals  Peak HR 190 bpm    Peak BP 129/67 mmHg    Exercise Data  MPHR 165 bpm    Percent HR 115 %    RPE 16     Estimated workload 17.2 METS        Assessment / Plan: 1. Coronary disease status post stenting of the proximal LAD in February 2010 with a 3.0 x 15 mm Xience stent. ETT in Dec. 2017 was normal. He remains  Asymptomatic and is exercising regularly. Continue current therapy with ASA and statin. Will plan follow up ETT in one year.  2. Hyperlipidemia on Crestor- Excellent control.  Follow up in one year.

## 2018-05-01 ENCOUNTER — Ambulatory Visit: Payer: 59 | Admitting: Cardiology

## 2018-05-01 ENCOUNTER — Other Ambulatory Visit: Payer: Self-pay

## 2018-05-01 ENCOUNTER — Encounter: Payer: Self-pay | Admitting: Cardiology

## 2018-05-01 VITALS — BP 108/58 | HR 81 | Ht 66.5 in | Wt 161.0 lb

## 2018-05-01 DIAGNOSIS — I2510 Atherosclerotic heart disease of native coronary artery without angina pectoris: Secondary | ICD-10-CM | POA: Diagnosis not present

## 2018-05-01 DIAGNOSIS — E785 Hyperlipidemia, unspecified: Secondary | ICD-10-CM | POA: Diagnosis not present

## 2018-05-01 MED ORDER — ROSUVASTATIN CALCIUM 40 MG PO TABS: 40.0000 mg | ORAL_TABLET | Freq: Every day | ORAL | 3 refills | Status: DC

## 2018-06-18 ENCOUNTER — Other Ambulatory Visit: Payer: Self-pay

## 2018-06-18 ENCOUNTER — Emergency Department (HOSPITAL_COMMUNITY): Payer: 59

## 2018-06-18 ENCOUNTER — Emergency Department (HOSPITAL_COMMUNITY)
Admission: EM | Admit: 2018-06-18 | Discharge: 2018-06-18 | Disposition: A | Discharge status: Home / Self Care | Payer: 59 | Attending: Emergency Medicine | Admitting: Emergency Medicine

## 2018-06-18 ENCOUNTER — Encounter (HOSPITAL_COMMUNITY): Payer: Self-pay | Admitting: *Deleted

## 2018-06-18 DIAGNOSIS — R109 Unspecified abdominal pain: Secondary | ICD-10-CM

## 2018-06-18 DIAGNOSIS — Z7982 Long term (current) use of aspirin: Secondary | ICD-10-CM | POA: Insufficient documentation

## 2018-06-18 DIAGNOSIS — R079 Chest pain, unspecified: Secondary | ICD-10-CM | POA: Diagnosis not present

## 2018-06-18 DIAGNOSIS — Z79899 Other long term (current) drug therapy: Secondary | ICD-10-CM | POA: Insufficient documentation

## 2018-06-18 DIAGNOSIS — I259 Chronic ischemic heart disease, unspecified: Secondary | ICD-10-CM | POA: Diagnosis not present

## 2018-06-18 DIAGNOSIS — K5900 Constipation, unspecified: Secondary | ICD-10-CM | POA: Diagnosis not present

## 2018-06-18 DIAGNOSIS — R1084 Generalized abdominal pain: Secondary | ICD-10-CM | POA: Diagnosis not present

## 2018-06-18 DIAGNOSIS — R0789 Other chest pain: Secondary | ICD-10-CM | POA: Diagnosis not present

## 2018-06-18 DIAGNOSIS — I700 Atherosclerosis of aorta: Secondary | ICD-10-CM | POA: Diagnosis not present

## 2018-06-18 LAB — BASIC METABOLIC PANEL
Anion gap: 10 (ref 5–15)
BUN: 17 mg/dL (ref 6–20)
CHLORIDE: 100 mmol/L (ref 98–111)
CO2: 27 mmol/L (ref 22–32)
Calcium: 9.5 mg/dL (ref 8.9–10.3)
Creatinine, Ser: 0.7 mg/dL (ref 0.61–1.24)
GFR calc non Af Amer: >60 mL/min (ref >60)
Glucose, Bld: 113 mg/dL — ABNORMAL HIGH (ref 70–99)
POTASSIUM: 3.4 mmol/L — AB (ref 3.5–5.1)
Sodium: 137 mmol/L (ref 135–145)

## 2018-06-18 LAB — HEPATIC FUNCTION PANEL
ALT: 23 U/L (ref 0–44)
AST: 24 U/L (ref 15–41)
Albumin: 4.5 g/dL (ref 3.5–5.0)
Alkaline Phosphatase: 51 U/L (ref 38–126)
BILIRUBIN INDIRECT: 1.4 mg/dL — AB (ref 0.3–0.9)
Bilirubin, Direct: 0.3 mg/dL — ABNORMAL HIGH (ref 0.0–0.2)
Total Bilirubin: 1.7 mg/dL — ABNORMAL HIGH (ref 0.3–1.2)
Total Protein: 7.7 g/dL (ref 6.5–8.1)

## 2018-06-18 LAB — POCT I-STAT TROPONIN I
Troponin i, poc: 0 ng/mL (ref 0.00–0.08)
Troponin i, poc: 0 ng/mL (ref 0.00–0.08)

## 2018-06-18 LAB — CBC
HEMATOCRIT: 44 % (ref 39.0–52.0)
HEMOGLOBIN: 13.1 g/dL (ref 13.0–17.0)
MCH: 21.3 pg — ABNORMAL LOW (ref 26.0–34.0)
MCHC: 29.8 g/dL — ABNORMAL LOW (ref 30.0–36.0)
MCV: 71.5 fL — ABNORMAL LOW (ref 80.0–100.0)
NRBC: 0 % (ref 0.0–0.2)
Platelets: 128 10*3/uL — ABNORMAL LOW (ref 150–400)
RBC: 6.15 MIL/uL — AB (ref 4.22–5.81)
RDW: 14.4 % (ref 11.5–15.5)
WBC: 6.6 10*3/uL (ref 4.0–10.5)

## 2018-06-18 LAB — OCCULT BLOOD, POC DEVICE: Fecal Occult Bld: NEGATIVE

## 2018-06-18 LAB — LIPASE, BLOOD: Lipase: 34 U/L (ref 11–51)

## 2018-06-18 MED ORDER — SODIUM CHLORIDE 0.9% FLUSH
3.0000 mL | Freq: Once | INTRAVENOUS | Status: AC
  Administered 2018-06-18: 3 mL via INTRAVENOUS

## 2018-06-18 MED ORDER — LIDOCAINE VISCOUS HCL 2 % MT SOLN
15.0000 mL | Freq: Once | OROMUCOSAL | Status: AC
  Administered 2018-06-18: 15 mL via ORAL
  Filled 2018-06-18: qty 15

## 2018-06-18 MED ORDER — IOPAMIDOL (ISOVUE-370) INJECTION 76%
100.0000 mL | Freq: Once | INTRAVENOUS | Status: AC | PRN
  Administered 2018-06-18: 100 mL via INTRAVENOUS

## 2018-06-18 MED ORDER — ALUM & MAG HYDROXIDE-SIMETH 200-200-20 MG/5ML PO SUSP
30.0000 mL | Freq: Once | ORAL | Status: AC
  Administered 2018-06-18: 30 mL via ORAL
  Filled 2018-06-18: qty 30

## 2018-06-18 MED ORDER — SUCRALFATE 1 GM/10ML PO SUSP: 1.0000 g | Freq: Three times a day (TID) | ORAL | 0 refills | Status: DC

## 2018-06-18 MED ORDER — DICYCLOMINE HCL 10 MG/ML IM SOLN
20.0000 mg | Freq: Once | INTRAMUSCULAR | Status: AC
  Administered 2018-06-18: 20 mg via INTRAMUSCULAR
  Filled 2018-06-18: qty 2

## 2018-06-18 MED ORDER — DICYCLOMINE HCL 20 MG PO TABS: 20.0000 mg | ORAL_TABLET | Freq: Two times a day (BID) | ORAL | 0 refills | Status: DC

## 2018-06-18 MED ORDER — FENTANYL CITRATE (PF) 100 MCG/2ML IJ SOLN
50.0000 ug | INTRAMUSCULAR | Status: DC | PRN
  Administered 2018-06-18: 50 ug via INTRAVENOUS
  Filled 2018-06-18: qty 2

## 2018-06-18 MED ORDER — ONDANSETRON HCL 4 MG/2ML IJ SOLN
4.0000 mg | Freq: Once | INTRAMUSCULAR | Status: AC
  Administered 2018-06-18: 4 mg via INTRAVENOUS
  Filled 2018-06-18: qty 2

## 2018-06-18 MED ORDER — IOPAMIDOL (ISOVUE-370) INJECTION 76%
INTRAVENOUS | Status: AC
  Filled 2018-06-18: qty 100

## 2018-06-18 MED ORDER — FENTANYL CITRATE (PF) 100 MCG/2ML IJ SOLN
100.0000 ug | Freq: Once | INTRAMUSCULAR | Status: AC
  Administered 2018-06-18: 100 ug via INTRAVENOUS
  Filled 2018-06-18: qty 2

## 2018-06-18 MED ORDER — KETOROLAC TROMETHAMINE 30 MG/ML IJ SOLN
15.0000 mg | Freq: Once | INTRAMUSCULAR | Status: AC
  Administered 2018-06-18: 15 mg via INTRAVENOUS
  Filled 2018-06-18: qty 1

## 2018-06-18 MED ORDER — OMEPRAZOLE 20 MG PO CPDR: 20.0000 mg | DELAYED_RELEASE_CAPSULE | Freq: Every day | ORAL | 0 refills | Status: DC

## 2018-06-18 MED ORDER — SODIUM CHLORIDE (PF) 0.9 % IJ SOLN
INTRAMUSCULAR | Status: AC
  Filled 2018-06-18: qty 50

## 2018-06-18 NOTE — ED Triage Notes (Signed)
Pt reports chest pressure which woke him up from his sleep about an hour ago.  Took 2 tums without relief.  Pain is non-radiating.  Hx of an MI x 10 years ago, but reports this feels different.  Denies any other sxs.  He is A&Ox 4, in NAD.

## 2018-06-18 NOTE — ED Provider Notes (Addendum)
[facility]-EMERGENCY DEPT Provider Note   CSN: [id number] Arrival date & time: 06/18/18  0143     History   Chief Complaint Chief Complaint  Patient presents with  . Chest Pain    HPI Bob Adams is a 58 y.o. male.  The history is provided by the patient.  Chest Pain  Pain location:  Epigastric Pain quality: aching   Pain radiates to:  Does not radiate Pain severity:  Severe Onset quality:  Sudden Timing:  Constant Progression:  Unchanged Chronicity:  New Context: at rest   Context: not eating and not lifting   Context comment:  Salad with pomegranate vinegrette many hours prior to pain.  Patient was awakened with pain.   Relieved by:  Nothing Worsened by:  Nothing Ineffective treatments:  None tried Associated symptoms: abdominal pain   Associated symptoms: no altered mental status, no back pain, no dysphagia, no fever, no headache, no lower extremity edema, no palpitations, no shortness of breath, no syncope and no vomiting   Risk factors: no aortic disease   Patient complains of epigastric pain without radiation and belching for several hours.  States this is not like his previous cardiac pain and it hurts in his upper abdomen when you push on it.  Pain comes in waves.  No n/v/d.  No diarrhea.  Denies constipation.    Past Medical History:  Diagnosis Date  . Allergy   . Coronary artery disease 2011   stent  . Dyslipidemia   . GERD (gastroesophageal reflux disease)   . Non Q wave myocardial infarction (HCC) 06/2009   stent  . Right inguinal hernia 11/17/2017    Patient Active Problem List   Diagnosis Date Noted  . Right inguinal hernia 11/17/2017  . Trigger thumb, right thumb 08/18/2017  . Bilateral groin pain 01/11/2013  . Coronary artery disease   . Dyslipidemia   . Non Q wave myocardial infarction (HCC) 06/20/2008    Past Surgical History:  Procedure Laterality Date  . CARDIOVASCULAR STRESS TEST  10/24/2009   EF 60%  .  CORONARY STENT PLACEMENT  06/28/2008   SUCCESSFUL INTRACORONARY STENTING OF THE PROXIMAL LEFT ANTERIOR DESCENDING WITH THROMBUS EXTRACTION. EF 65%  . fracture arm    . INGUINAL HERNIA REPAIR Right 11/17/2017   Procedure: OPEN RIGHT INGUINAL HERNIA REPAIR;  Surgeon: Claud Kelp, MD;  Location: [facility];  Service: General;  Laterality: Right;  . INSERTION OF MESH Right 11/17/2017   Procedure: INSERTION OF MESH;  Surgeon: Claud Kelp, MD;  Location: [facility];  Service: General;  Laterality: Right;  . KNEE ARTHROSCOPY          Home Medications    Prior to Admission medications   Medication Sig Start Date End Date Taking? Authorizing Provider  aspirin 81 MG tablet Take 81 mg by mouth daily.     Yes [provider]  nitroGLYCERIN (NITROSTAT) 0.4 MG SL tablet Place 0.4 mg under the tongue every 5 (five) minutes as needed for chest pain.    Yes [provider]  rosuvastatin (CRESTOR) 40 MG tablet Take 1 tablet (40 mg total) by mouth daily. 05/01/18  Yes [place], Peter M, MD    Family History Family History  Problem Relation Age of Onset  . Heart attack Mother   . Colon cancer Neg Hx     Social History Social History   Tobacco Use  . Smoking status: Never Smoker  . Smokeless tobacco: Never Used  Substance Use Topics  . Alcohol use: No  . Drug use: No     Allergies   Patient has no known allergies.   Review of Systems Review of Systems  Constitutional: Negative for fever.  HENT: Negative for trouble swallowing.   Respiratory: Negative for chest tightness and shortness of breath.   Cardiovascular: Positive for chest pain. Negative for palpitations, leg swelling and syncope.  Gastrointestinal: Positive for abdominal pain. Negative for anal bleeding, blood in stool, diarrhea and vomiting.  Genitourinary: Negative for dysuria.  Musculoskeletal: Negative for back pain.  Neurological: Negative for headaches.  All other  systems reviewed and are negative.    Physical Exam Updated Vital Signs BP 107/64   Pulse 96   Temp 97.6 F (36.4 C) (Oral)   Resp (!) 21   Ht 5\' 7"  (1.702 m)   Wt 69.9 kg   SpO2 94%   BMI 24.12 kg/m   Physical Exam Vitals signs and nursing note reviewed.  Constitutional:      Appearance: Normal appearance. He is obese. He is not ill-appearing.  HENT:     Head: Normocephalic and atraumatic.     Nose: Nose normal.     Mouth/Throat:     Mouth: Mucous membranes are moist.     Pharynx: Oropharynx is clear.  Eyes:     Extraocular Movements: Extraocular movements intact.     Pupils: Pupils are equal, round, and reactive to light.  Neck:     Musculoskeletal: Normal range of motion and neck supple.  Cardiovascular:     Rate and Rhythm: Normal rate and regular rhythm.     Pulses: Normal pulses.     Heart sounds: Normal heart sounds.  Pulmonary:     Effort: Pulmonary effort is normal.     Breath sounds: Normal breath sounds.  Abdominal:     General: Abdomen is flat. Bowel sounds are increased. There is no distension.     Palpations: There is no fluid wave.     Tenderness: There is abdominal tenderness in the epigastric area. There is no guarding or rebound. Negative signs include Murphy's sign, Rovsing's sign and McBurney's sign.     Hernia: No hernia is present.  Genitourinary:    Rectum: Guaiac result negative.     Comments: Moderate soft stool high in vault Musculoskeletal: Normal range of motion.  Skin:    General: Skin is warm and dry.     Capillary Refill: Capillary refill takes less than 2 seconds.  Neurological:     General: No focal deficit present.     Mental Status: He is alert and oriented to person, place, and time.  Psychiatric:        Mood and Affect: Mood normal.        Behavior: Behavior normal.      ED Treatments / Results  Labs (all labs ordered are listed, but only abnormal results are displayed) Results for orders placed or performed during  the hospital encounter of 06/18/18  Basic metabolic panel  Result Value Ref Range   Sodium 137 135 - 145 mmol/L   Potassium 3.4 (L) 3.5 - 5.1 mmol/L   Chloride 100 98 - 111 mmol/L   CO2 27 22 - 32 mmol/L   Glucose, Bld 113 (H) 70 - 99 mg/dL   BUN 17 6 - 20 mg/dL   Creatinine, Ser 1.610.70 0.61 - 1.24 mg/dL   Calcium 9.5 8.9 - 09.610.3 mg/dL   GFR calc non Af Amer >60 >60 mL/min  GFR calc Af Amer >60 >60 mL/min   Anion gap 10 5 - 15  CBC  Result Value Ref Range   WBC 6.6 4.0 - 10.5 K/uL   RBC 6.15 (H) 4.22 - 5.81 MIL/uL   Hemoglobin 13.1 13.0 - 17.0 g/dL   HCT 27.7 82.4 - 23.5 %   MCV 71.5 (L) 80.0 - 100.0 fL   MCH 21.3 (L) 26.0 - 34.0 pg   MCHC 29.8 (L) 30.0 - 36.0 g/dL   RDW 36.1 44.3 - 15.4 %   Platelets 128 (L) 150 - 400 K/uL   nRBC 0.0 0.0 - 0.2 %  Hepatic function panel  Result Value Ref Range   Total Protein 7.7 6.5 - 8.1 g/dL   Albumin 4.5 3.5 - 5.0 g/dL   AST 24 15 - 41 U/L   ALT 23 0 - 44 U/L   Alkaline Phosphatase 51 38 - 126 U/L   Total Bilirubin 1.7 (H) 0.3 - 1.2 mg/dL   Bilirubin, Direct 0.3 (H) 0.0 - 0.2 mg/dL   Indirect Bilirubin 1.4 (H) 0.3 - 0.9 mg/dL  Lipase, blood  Result Value Ref Range   Lipase 34 11 - 51 U/L  POCT i-Stat troponin I  Result Value Ref Range   Troponin i, poc 0.00 0.00 - 0.08 ng/mL   Comment 3          Occult blood, poc device  Result Value Ref Range   Fecal Occult Bld NEGATIVE NEGATIVE  POCT i-Stat troponin I  Result Value Ref Range   Troponin i, poc 0.00 0.00 - 0.08 ng/mL   Comment 3           Dg Chest 2 View  Result Date: 06/18/2018 CLINICAL DATA:  Mid chest pain. EXAM: CHEST - 2 VIEW COMPARISON:  06/27/2008 FINDINGS: The cardiomediastinal contours are normal. The lungs are clear. Pulmonary vasculature is normal. No consolidation, pleural effusion, or pneumothorax. No acute osseous abnormalities are seen. IMPRESSION: No acute chest findings. Electronically Signed   By: Narda Rutherford M.D.   On: 06/18/2018 02:19   US Abdomen  Limited  Result Date: 06/18/2018 CLINICAL DATA:  58 y/o  M; abdominal pain. EXAM: ULTRASOUND ABDOMEN LIMITED RIGHT UPPER QUADRANT COMPARISON:  None. FINDINGS: Gallbladder: No gallstones or wall thickening visualized. No sonographic Murphy sign noted by sonographer. Common bile duct: Diameter: 6 mm Liver: No focal lesion identified. Within normal limits in parenchymal echogenicity. Portal vein is patent on color Doppler imaging with normal direction of blood flow towards the liver. IMPRESSION: Normal right upper quadrant ultrasound. Electronically Signed   By: Mitzi Hansen M.D.   On: 06/18/2018 06:26   Ct Angio Chest/abd/pel For Dissection W And/or Wo Contrast  Result Date: 06/18/2018 CLINICAL DATA:  Awoke with chest pressure. EXAM: CT ANGIOGRAPHY CHEST, ABDOMEN AND PELVIS TECHNIQUE: Multidetector CT imaging through the chest, abdomen and pelvis was performed using the standard protocol during bolus administration of intravenous contrast. Multiplanar reconstructed images and MIPs were obtained and reviewed to evaluate the vascular anatomy. CONTRAST:  ISOVUE-370 IOPAMIDOL (ISOVUE-370) INJECTION 76% COMPARISON:  Chest x-ray from earlier today FINDINGS: CTA CHEST FINDINGS Cardiovascular: Noncontrast phase shows no aortic intramural hematoma. There is extensive coronary atherosclerotic calcification with probable LAD stent. Postcontrast imaging shows no aortic dissection. No aortic aneurysm. The major pulmonary arteries are opacified. Normal heart size. No pericardial effusion. Mediastinum/Nodes: No acute finding or adenopathy. Lungs/Pleura: There is no edema, consolidation, effusion, or pneumothorax. Small air defect along the posterior wall trachea usually related  to adherent mucus. Musculoskeletal: No acute finding. Review of the MIP images confirms the above findings. CTA ABDOMEN AND PELVIS FINDINGS VASCULAR Aorta: Mild atherosclerotic plaque.  No aneurysm or dissection. Celiac: Calcified  plaque at the origin with moderate ostial narrowing that may be accentuated by median arcuate ligament impression. No branch occlusion. SMA: Widely patent.  No beading or branch occlusion. Renals: Single bilateral renal arteries. Mild atherosclerotic plaque at the origins without stenosis. No beading. IMA: Patent Inflow: Mild atherosclerotic calcification. No stenosis or dissection. Veins: Unremarkable in the arterial phase Review of the MIP images confirms the above findings. NON-VASCULAR Hepatobiliary: No focal liver abnormality.No evidence of biliary obstruction or stone. Pancreas: Unremarkable. Spleen: Unremarkable. Adrenals/Urinary Tract: Negative adrenals. No hydronephrosis or stone. Small renal cystic densities. Unremarkable bladder. Stomach/Bowel:  No obstruction. Negative for bowel obstruction. Lymphatic: No mass or adenopathy. Reproductive:No pathologic findings. Other: No ascites or pneumoperitoneum. Right inguinal hernia repair. Musculoskeletal: No acute abnormalities. S1 hemivertebra with levoscoliosis and focal advanced left L5-S1 facet degeneration causing foraminal narrowing. Review of the MIP images confirms the above findings. IMPRESSION: 1. Negative for aortic dissection or other acute finding. 2. Multifocal coronary atherosclerosis. 3. Mild aortic atherosclerosis.  Moderate celiac origin stenosis. Electronically Signed   By: Marnee Spring M.D.   On: 06/18/2018 05:01    EKG EKG Interpretation  Date/Time:  Thursday June 18 2018 03:38:42 EST Ventricular Rate:  70 PR Interval:    QRS Duration: 78 QT Interval:  383 QTC Calculation: 414 R Axis:   29 Text Interpretation:  Sinus rhythm Confirmed by Marizol Borror ([id number]) on 06/18/2018 3:46:05 AM   Radiology Dg Chest 2 View  Result Date: 06/18/2018 CLINICAL DATA:  Mid chest pain. EXAM: CHEST - 2 VIEW COMPARISON:  06/27/2008 FINDINGS: The cardiomediastinal contours are normal. The lungs are clear. Pulmonary vasculature is normal. No  consolidation, pleural effusion, or pneumothorax. No acute osseous abnormalities are seen. IMPRESSION: No acute chest findings. Electronically Signed   By: Narda Rutherford M.D.   On: 06/18/2018 02:19   Ct Angio Chest/abd/pel For Dissection W And/or Wo Contrast  Result Date: 06/18/2018 CLINICAL DATA:  Awoke with chest pressure. EXAM: CT ANGIOGRAPHY CHEST, ABDOMEN AND PELVIS TECHNIQUE: Multidetector CT imaging through the chest, abdomen and pelvis was performed using the standard protocol during bolus administration of intravenous contrast. Multiplanar reconstructed images and MIPs were obtained and reviewed to evaluate the vascular anatomy. CONTRAST:  ISOVUE-370 IOPAMIDOL (ISOVUE-370) INJECTION 76% COMPARISON:  Chest x-ray from earlier today FINDINGS: CTA CHEST FINDINGS Cardiovascular: Noncontrast phase shows no aortic intramural hematoma. There is extensive coronary atherosclerotic calcification with probable LAD stent. Postcontrast imaging shows no aortic dissection. No aortic aneurysm. The major pulmonary arteries are opacified. Normal heart size. No pericardial effusion. Mediastinum/Nodes: No acute finding or adenopathy. Lungs/Pleura: There is no edema, consolidation, effusion, or pneumothorax. Small air defect along the posterior wall trachea usually related to adherent mucus. Musculoskeletal: No acute finding. Review of the MIP images confirms the above findings. CTA ABDOMEN AND PELVIS FINDINGS VASCULAR Aorta: Mild atherosclerotic plaque.  No aneurysm or dissection. Celiac: Calcified plaque at the origin with moderate ostial narrowing that may be accentuated by median arcuate ligament impression. No branch occlusion. SMA: Widely patent.  No beading or branch occlusion. Renals: Single bilateral renal arteries. Mild atherosclerotic plaque at the origins without stenosis. No beading. IMA: Patent Inflow: Mild atherosclerotic calcification. No stenosis or dissection. Veins: Unremarkable in the arterial  phase Review of the MIP images confirms the above findings. NON-VASCULAR Hepatobiliary: No  focal liver abnormality.No evidence of biliary obstruction or stone. Pancreas: Unremarkable. Spleen: Unremarkable. Adrenals/Urinary Tract: Negative adrenals. No hydronephrosis or stone. Small renal cystic densities. Unremarkable bladder. Stomach/Bowel:  No obstruction. Negative for bowel obstruction. Lymphatic: No mass or adenopathy. Reproductive:No pathologic findings. Other: No ascites or pneumoperitoneum. Right inguinal hernia repair. Musculoskeletal: No acute abnormalities. S1 hemivertebra with levoscoliosis and focal advanced left L5-S1 facet degeneration causing foraminal narrowing. Review of the MIP images confirms the above findings. IMPRESSION: 1. Negative for aortic dissection or other acute finding. 2. Multifocal coronary atherosclerosis. 3. Mild aortic atherosclerosis.  Moderate celiac origin stenosis. Electronically Signed   By: Marnee SpringJonathon  Watts M.D.   On: 06/18/2018 05:01    Procedures Procedures (including critical care time)  Medications Ordered in ED Medications  fentaNYL (SUBLIMAZE) injection 50 mcg (50 mcg Intravenous Given 06/18/18 0310)  sodium chloride (PF) 0.9 % injection (has no administration in time range)  iopamidol (ISOVUE-370) 76 % injection (has no administration in time range)  ketorolac (TORADOL) 30 MG/ML injection 15 mg (has no administration in time range)  sodium chloride flush (NS) 0.9 % injection 3 mL (3 mLs Intravenous Given 06/18/18 0325)  ondansetron (ZOFRAN) injection 4 mg (4 mg Intravenous Given 06/18/18 0315)  alum & mag hydroxide-simeth (MAALOX/MYLANTA) 200-200-20 MG/5ML suspension 30 mL (30 mLs Oral Given 06/18/18 0400)    And  lidocaine (XYLOCAINE) 2 % viscous mouth solution 15 mL (15 mLs Oral Given 06/18/18 0402)  dicyclomine (BENTYL) injection 20 mg (20 mg Intramuscular Given 06/18/18 0407)  fentaNYL (SUBLIMAZE) injection 100 mcg (100 mcg Intravenous Given 06/18/18  0521)  iopamidol (ISOVUE-370) 76 % injection 100 mL (100 mLs Intravenous Contrast Given 06/18/18 0430)     Ruled out for MI with multiple normal EKGs and 2 normal troponins.  Patient based on exam has abdominal pain and given his history he was ruled out for dissection.  Also ruled out cholecystitis.  Patient appears much more comfortable after medication and I have informed both he and his wife of all his results.  Po challenged successfully in the ED.  Have advised close follow up with PMD and GI if symptoms persist.    Final Clinical Impressions(s) / ED Diagnoses   I believe this is likely gas pain and mild constipation.  Will start simethicone, bentyl, omeprazole and miralax and have patient follow up with PMD.    Return for pain, intractable cough, productive cough,fevers >100.4 unrelieved by medication, shortness of breath, intractable vomiting, or diarrhea, abdominal pain, passing out, Inability to tolerate liquids or food, cough, altered mental status or any concerns. No signs of systemic illness or infection. The patient is nontoxic-appearing on exam and vital signs are within normal limits.   I have reviewed the triage vital signs and the nursing notes. Pertinent labs &imaging results that were available during my care of the patient were reviewed by me and considered in my medical decision making (see chart for details).  After history, exam, and medical workup I feel the patient has been appropriately medically screened and is safe for discharge home. Pertinent diagnoses were discussed with the patient. Patient was given return precautions.    Shaya Altamura, MD 06/18/18 [id number]    Cy BlamerPalumbo, Emmet Messer, MD 06/18/18 [id number]

## 2018-06-26 DIAGNOSIS — Z0000 Encounter for general adult medical examination without abnormal findings: Secondary | ICD-10-CM | POA: Diagnosis not present

## 2018-06-26 DIAGNOSIS — R82998 Other abnormal findings in urine: Secondary | ICD-10-CM | POA: Diagnosis not present

## 2018-06-26 DIAGNOSIS — R7301 Impaired fasting glucose: Secondary | ICD-10-CM | POA: Diagnosis not present

## 2018-07-03 DIAGNOSIS — Z125 Encounter for screening for malignant neoplasm of prostate: Secondary | ICD-10-CM | POA: Diagnosis not present

## 2018-07-03 DIAGNOSIS — R0789 Other chest pain: Secondary | ICD-10-CM | POA: Diagnosis not present

## 2018-07-03 DIAGNOSIS — R7301 Impaired fasting glucose: Secondary | ICD-10-CM | POA: Diagnosis not present

## 2018-07-03 DIAGNOSIS — E7849 Other hyperlipidemia: Secondary | ICD-10-CM | POA: Diagnosis not present

## 2018-07-03 DIAGNOSIS — Z0000 Encounter for general adult medical examination without abnormal findings: Secondary | ICD-10-CM | POA: Diagnosis not present

## 2018-07-10 DIAGNOSIS — Z1212 Encounter for screening for malignant neoplasm of rectum: Secondary | ICD-10-CM | POA: Diagnosis not present

## 2019-03-30 ENCOUNTER — Ambulatory Visit: Payer: Self-pay | Admitting: Cardiology

## 2019-04-11 NOTE — Progress Notes (Signed)
Bob Adams Date of Birth: 04/23/1961   History of Present Illness: Bob Adams is seen today for followup. He is status post stenting of the proximal LAD in 2010 with a drug-eluting stent. He also had 70% RCA disease and 50% LCx disease at that time.  Stress Echo in June 2011 was normal. ETT in Dec. 2017 was normal.  On follow up today he is doing well. He is not exercising as much due to the pandemic. He has gained 12 lbs this year. Does exercise vigorously on the weekend without chest pain or dyspnea.   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  Current Outpatient Medications on File Prior to Visit  Medication Sig Dispense Refill  . aspirin 81 MG tablet Take 81 mg by mouth daily.      Marland Kitchen dicyclomine (BENTYL) 20 MG tablet Take 1 tablet (20 mg total) by mouth 2 (two) times daily. 20 tablet 0  . nitroGLYCERIN (NITROSTAT) 0.4 MG SL tablet Place 0.4 mg under the tongue every 5 (five) minutes as needed for chest pain.     Marland Kitchen omeprazole (PRILOSEC) 20 MG capsule Take 1 capsule (20 mg total) by mouth daily. 30 capsule 0  . sucralfate (CARAFATE) 1 GM/10ML suspension Take 10 mLs (1 g total) by mouth 4 (four) times daily -  with meals and at bedtime. 420 mL 0   No current facility-administered medications on file prior to visit.     No Known Allergies  Past Medical History:  Diagnosis Date  . Allergy   . Coronary artery disease 2011   stent  . Dyslipidemia   . GERD (gastroesophageal reflux disease)   . Non Q wave myocardial infarction (HCC) 06/2009   stent  . Right inguinal hernia 11/17/2017    Past Surgical History:  Procedure Laterality Date  . CARDIOVASCULAR STRESS TEST  10/24/2009   EF 60%  . CORONARY STENT PLACEMENT  06/28/2008   SUCCESSFUL INTRACORONARY STENTING OF THE PROXIMAL LEFT ANTERIOR DESCENDING WITH THROMBUS EXTRACTION. EF 65%  . fracture arm    . INGUINAL HERNIA REPAIR Right 11/17/2017   Procedure: OPEN RIGHT INGUINAL HERNIA REPAIR;  Surgeon: Claud Kelp, MD;   Location: [facility];  Service: General;  Laterality: Right;  . INSERTION OF MESH Right 11/17/2017   Procedure: INSERTION OF MESH;  Surgeon: Claud Kelp, MD;  Location: [facility];  Service: General;  Laterality: Right;  . KNEE ARTHROSCOPY      Social History   Tobacco Use  Smoking Status Never Smoker  Smokeless Tobacco Never Used    Social History   Substance and Sexual Activity  Alcohol Use No    Family History  Problem Relation Age of Onset  . Heart attack Mother   . Colon cancer Neg Hx     Review of Systems:  All other systems were reviewed and are negative.  Physical Exam: BP 133/69   Pulse 64   Ht 5\' 6"  (1.676 m)   Wt 166 lb 12.8 oz (75.7 kg)   SpO2 99%   BMI 26.92 kg/m  GENERAL:  Well appearing male in NAD HEENT:  PERRL, EOMI, sclera are clear. Oropharynx is clear. NECK:  No jugular venous distention, carotid upstroke brisk and symmetric, no bruits, no thyromegaly or adenopathy LUNGS:  Clear to auscultation bilaterally CHEST:  Unremarkable HEART:  RRR,  PMI not displaced or sustained,S1 and S2 within normal limits, no S3, no S4: no clicks, no rubs, no murmurs ABD:  Soft, nontender.  BS +, no masses or bruits. No hepatomegaly, no splenomegaly EXT:  2 + pulses throughout, no edema, no cyanosis no clubbing SKIN:  Warm and dry.  No rashes NEURO:  Alert and oriented x 3. Cranial nerves II through XII intact. PSYCH:  Cognitively intact   LABORATORY DATA:  Dated 06/09/15: cholesterol 117, triglycerides 77, LDL 71, HDL 31. A1c 4.5%. CMET, CBC, TSH normal Dated 06/14/16: cholesterol 117, triglycerides 65, HDL 41, LDL 63, A1c 4.3%. Hgb 12.3. Chemistries normal. Dated 06/20/17: cholesterol 101, triglycerides 64, HDL 37, LDL 51, plts 131K. T. Bili 2.6. Other chemistries and CBC normal. TSH normal. Dated 06/26/18: cholesterol 124, triglycerides 73, HDL 36, LDL 73. A1c 4.5%. plts 128K. Other CMET, CBC, TSH normal.    ETT 05/15/16:Study  Highlights    There was no ST segment deviation noted during stress.   ETT with excellent exercise tolerance (14:01); no chest pain; normal BP response; no ST changes; negative adequate ETT.   Stress Findings   ECG Baseline ECG exhibits normal sinus rhythm..    Stress Findings The patient exercised following the Bruce protocol.  The patient reported no symptoms during the stress test. The patient experienced no angina during the stress test.   The test was stopped because the patient complained of fatigue.   Blood pressure and heart rate demonstrated a normal response to exercise. Overall, the patient's exercise capacity was excellent.   85% of maximum heart rate was achieved after 8 minutes. Recovery time: 15 minutes.  Duke Treadmill Score: intermediate risk The patient's response to exercise was adequate for diagnosis.    Response to Stress There was no ST segment deviation noted during stress.  Arrhythmias during stress: none.  Arrhythmias during recovery: none.  There were no significant arrhythmias noted during the test.  ECG was interpretable and there was no significant change from baseline.    Stress Measurements   Baseline Vitals  Rest HR 85 bpm    Rest BP 117/74 mmHg    Exercise Time  Exercise duration (min) 14 min    Exercise duration (sec) 1 sec    Peak Stress Vitals  Peak HR 190 bpm    Peak BP 129/67 mmHg    Exercise Data  MPHR 165 bpm    Percent HR 115 %    RPE 16     Estimated workload 17.2 METS        Assessment / Plan: 1. Coronary disease status post stenting of the proximal LAD in February 2010 with a 3.0 x 15 mm Xience stent. ETT in Dec. 2017 was normal. He remains  Asymptomatic. Needs to focus more on regular exercise and weight control. Continue current therapy with ASA and statin. We had considered a follow up ETT but given pandemic we will postpone until next year unless he develops symptoms.   2. Hyperlipidemia on Crestor- LDL a  little higher reflecting weight gain. Focus on weight control and exercise.   Follow up in one year.

## 2019-04-14 ENCOUNTER — Other Ambulatory Visit: Payer: Self-pay

## 2019-04-14 ENCOUNTER — Ambulatory Visit: Payer: 59 | Admitting: Cardiology

## 2019-04-14 ENCOUNTER — Encounter: Payer: Self-pay | Admitting: Cardiology

## 2019-04-14 VITALS — BP 133/69 | HR 64 | Ht 66.0 in | Wt 166.8 lb

## 2019-04-14 DIAGNOSIS — E785 Hyperlipidemia, unspecified: Secondary | ICD-10-CM | POA: Diagnosis not present

## 2019-04-14 DIAGNOSIS — I2510 Atherosclerotic heart disease of native coronary artery without angina pectoris: Secondary | ICD-10-CM

## 2019-04-14 MED ORDER — ROSUVASTATIN CALCIUM 40 MG PO TABS: 40.0000 mg | ORAL_TABLET | Freq: Every day | ORAL | 3 refills | Status: DC

## 2019-07-29 ENCOUNTER — Ambulatory Visit: Payer: 59 | Attending: Internal Medicine

## 2019-07-29 DIAGNOSIS — Z23 Encounter for immunization: Secondary | ICD-10-CM

## 2019-07-29 NOTE — Progress Notes (Signed)
   Covid-19 Vaccination Clinic  Name:  Bob Adams    MRN: [id number] DOB: Sep 30, 1960  07/29/2019  Bob Adams was observed post Covid-19 immunization for 15 minutes without incident. He was provided with Vaccine Information Sheet and instruction to access the V-Safe system.   Bob Adams was instructed to call 911 with any severe reactions post vaccine: Marland Kitchen Difficulty breathing  . Swelling of face and throat  . A fast heartbeat  . A bad rash all over body  . Dizziness and weakness   Immunizations Administered    Name Date Dose VIS Date Route   Pfizer COVID-19 Vaccine 07/29/2019  3:01 PM 0.3 mL 04/30/2019 Intramuscular   Manufacturer: [employer], [employer]   Lot: [id number]   NDC: [address]-9

## 2019-08-23 ENCOUNTER — Ambulatory Visit: Payer: 59 | Attending: Internal Medicine

## 2019-08-23 DIAGNOSIS — Z23 Encounter for immunization: Secondary | ICD-10-CM

## 2019-08-23 NOTE — Progress Notes (Signed)
   Covid-19 Vaccination Clinic  Name:  RJ PEDROSA    MRN: [id number] DOB: 1961/02/23  08/23/2019  Mr. Pacer was observed post Covid-19 immunization for 15 minutes without incident. He was provided with Vaccine Information Sheet and instruction to access the V-Safe system.   Mr. Dorris was instructed to call 911 with any severe reactions post vaccine: Marland Kitchen Difficulty breathing  . Swelling of face and throat  . A fast heartbeat  . A bad rash all over body  . Dizziness and weakness   Immunizations Administered    Name Date Dose VIS Date Route   Pfizer COVID-19 Vaccine 08/23/2019 11:38 AM 0.3 mL 04/30/2019 Intramuscular   Manufacturer: [employer], [employer]   Lot: [id number]   NDC: [address]-3

## 2019-11-01 ENCOUNTER — Telehealth: Payer: Self-pay | Admitting: Cardiology

## 2019-11-01 DIAGNOSIS — R0789 Other chest pain: Secondary | ICD-10-CM

## 2019-11-01 DIAGNOSIS — I2510 Atherosclerotic heart disease of native coronary artery without angina pectoris: Secondary | ICD-10-CM

## 2019-11-01 NOTE — Telephone Encounter (Signed)
Spoke to patient he stated for the past 2 months he has been having chest tightness when he exercises.Stated he don't feel right.He has noticed a change in heart beat seems faster when exercising.Spoke to Dr.Jordan he advised to have a treadmill test.Scheduler will call back tomorrow with appointment.

## 2019-11-01 NOTE — Telephone Encounter (Signed)
New Message:      Pt says he have noted a great change heartbeat during exercise in the last 2 months. There is no discomfort, no shortness of breath. There is some tightness in his chest for the first ten minutes of exercising.

## 2019-11-02 ENCOUNTER — Ambulatory Visit (INDEPENDENT_AMBULATORY_CARE_PROVIDER_SITE_OTHER): Payer: 59

## 2019-11-02 ENCOUNTER — Other Ambulatory Visit: Payer: Self-pay

## 2019-11-02 DIAGNOSIS — I2510 Atherosclerotic heart disease of native coronary artery without angina pectoris: Secondary | ICD-10-CM | POA: Diagnosis not present

## 2019-11-02 DIAGNOSIS — R0789 Other chest pain: Secondary | ICD-10-CM

## 2019-11-02 LAB — EXERCISE TOLERANCE TEST
Estimated workload: 13.4 METS
Exercise duration (min): 10 min
Exercise duration (sec): 0 s
MPHR: 161 {beats}/min
Peak HR: 173 {beats}/min
Percent HR: 107 %
RPE: 14
Rest HR: 80 {beats}/min

## 2019-11-12 NOTE — Progress Notes (Signed)
Bob Adams Date of Birth: 08-16-1960   History of Present Illness: Bob Adams is seen today for followup of abnormal ETT.  He is status post stenting of the proximal LAD in 2010 with a drug-eluting stent. He also had 70% RCA disease and 50% LCx disease at that time.  Stress Echo in June 2011 was normal. ETT in Dec. 2017 was normal.  He states he was doing well until about 3-4 weeks ago. In April he began a month long fast for Ramadan. He lost weight which is typical. He did not exercise during this time and really didn't resume exercise for 2 weeks after to regain some strength and weight.  He states that when he resumed exercise he noted a significant difference in his HR. Before he could get his HR up to 160-170. But now HR didn't go over 126. He developed a sensation in the left pectoral region that felt like "an injury that is healing". If he let his HR drop the symptoms resolved. After 10 minute warm up symptoms would improve and he could continue exercise. He feels that something clearly has changed from before. An ETT was ordered. While exercise tolerance was still good it was not as good as before and he did develop new ST depression.       Current Outpatient Medications on File Prior to Visit  Medication Sig Dispense Refill  . aspirin 81 MG tablet Take 81 mg by mouth daily.      Marland Kitchen omeprazole (PRILOSEC) 20 MG capsule Take 1 capsule (20 mg total) by mouth daily. 30 capsule 0  . rosuvastatin (CRESTOR) 40 MG tablet Take 1 tablet (40 mg total) by mouth daily. 90 tablet 3   No current facility-administered medications on file prior to visit.    No Known Allergies  Past Medical History:  Diagnosis Date  . Allergy   . Coronary artery disease 2011   stent  . Dyslipidemia   . GERD (gastroesophageal reflux disease)   . Non Q wave myocardial infarction (HCC) 06/2009   stent  . Right inguinal hernia 11/17/2017    Past Surgical History:  Procedure Laterality Date  .  CARDIOVASCULAR STRESS TEST  10/24/2009   EF 60%  . CORONARY STENT PLACEMENT  06/28/2008   SUCCESSFUL INTRACORONARY STENTING OF THE PROXIMAL LEFT ANTERIOR DESCENDING WITH THROMBUS EXTRACTION. EF 65%  . fracture arm    . INGUINAL HERNIA REPAIR Right 11/17/2017   Procedure: OPEN RIGHT INGUINAL HERNIA REPAIR;  Surgeon: Claud Kelp, MD;  Location: [facility];  Service: General;  Laterality: Right;  . INSERTION OF MESH Right 11/17/2017   Procedure: INSERTION OF MESH;  Surgeon: Claud Kelp, MD;  Location: [facility];  Service: General;  Laterality: Right;  . 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  Family History  Problem Relation Age of Onset  . Heart attack Mother   . Colon cancer Neg Hx     Review of Systems:  All other systems were reviewed and are negative.  Physical Exam: BP 114/70 (BP Location: Left Arm, Patient Position: Sitting, Cuff Size: Normal)   Pulse 75   Ht 5\' 6"  (1.676 m)   Wt 154 lb (69.9 kg)   SpO2 95%   BMI 24.86 kg/m  GENERAL:  Well appearing male in NAD HEENT:  PERRL, EOMI, sclera are clear. Oropharynx is clear. NECK:  No jugular venous distention, carotid upstroke brisk and symmetric, no bruits, no thyromegaly or adenopathy LUNGS:  Clear to auscultation bilaterally CHEST:  Unremarkable HEART:  RRR,  PMI not displaced or sustained,S1 and S2 within normal limits, no S3, no S4: no clicks, no rubs, no murmurs ABD:  Soft, nontender. BS +, no masses or bruits. No hepatomegaly, no splenomegaly EXT:  2 + pulses throughout, no edema, no cyanosis no clubbing SKIN:  Warm and dry.  No rashes NEURO:  Alert and oriented x 3. Cranial nerves II through XII intact. PSYCH:  Cognitively intact   LABORATORY DATA:  Dated 06/09/15: cholesterol 117,  triglycerides 77, LDL 71, HDL 31. A1c 4.5%. CMET, CBC, TSH normal Dated 06/14/16: cholesterol 117, triglycerides 65, HDL 41, LDL 63, A1c 4.3%. Hgb 12.3. Chemistries normal. Dated 06/20/17: cholesterol 101, triglycerides 64, HDL 37, LDL 51, plts 131K. T. Bili 2.6. Other chemistries and CBC normal. TSH normal. Dated 06/26/18: cholesterol 124, triglycerides 73, HDL 36, LDL 73. A1c 4.5%. plts 128K. Other CMET, CBC, TSH normal.  Dated 07/14/19: cholesterol 124, triglycerides 55, HDL 44, LDL 69. A1c 4.7%. Normal CMET and CBC.   Ecg today shows NSR with normal Ecg. I have personally reviewed and interpreted this study.    ETT 05/15/16:Study Highlights    There was no ST segment deviation noted during stress.   ETT with excellent exercise tolerance (14:01); no chest pain; normal BP response; no ST changes; negative adequate ETT.   Stress Findings   ECG Baseline ECG exhibits normal sinus rhythm..    Stress Findings The patient exercised following the Bruce protocol.  The patient reported no symptoms during the stress test. The patient experienced no angina during the stress test.   The test was stopped because the patient complained of fatigue.   Blood pressure and heart rate demonstrated a normal response to exercise. Overall, the patient's exercise capacity was excellent.   85% of maximum heart rate was achieved after 8 minutes. Recovery time: 15 minutes.  Duke Treadmill Score: intermediate risk The patient's response to exercise was adequate for diagnosis.    Response to Stress There was no ST segment deviation noted during stress.  Arrhythmias during stress: none.  Arrhythmias during recovery: none.  There were no significant arrhythmias noted during the test.  ECG was interpretable and there was no significant change from baseline.    Stress Measurements   Baseline Vitals  Rest HR 85 bpm    Rest BP 117/74 mmHg    Exercise Time  Exercise duration (min) 14 min    Exercise  duration (sec) 1 sec    Peak Stress Vitals  Peak HR 190 bpm    Peak BP 129/67 mmHg    Exercise Data  MPHR 165 bpm    Percent HR 115 %    RPE 16     Estimated workload 17.2 METS       ETT 11/02/19: Study Highlights   Blood pressure demonstrated a normal response to exercise.  There was 43mm of horizontal to upsloping ST segment depression in the inferolateral leads at peak exercise which became downsloping in recovery.  Positive exercise stress test for ischemia.  The patient exercised for 10 minutes reahcing 107% MPHR.  Excellent exercise capacity achieving 13.4 mets.  Consider further evaluation with coronary CTA or Nuclear stress testing.  Assessment / Plan: 1. Coronary disease status post stenting of  the proximal LAD in February 2010 with a 3.0 x 15 mm Xience stent. He had residual moderate disease in the LCx and RCA. ETT in Dec. 2017 was normal. He now is experiencing exertional symptoms c/w angina. ETT showed good exercise tolerance but has decreased from before and he has ST depression that is new. Continue current therapy with ASA and statin. Given Rx for sl Ntg. I have recommended he curtail his exercise until we can perform more definitive evaluation with cardiac cath. His family is out of the country for this month and we will plan to schedule him for cath on July 30. The procedure and risks were reviewed including but not limited to death, myocardial infarction, stroke, arrythmias, bleeding, transfusion, emergency surgery, dye allergy, or renal dysfunction. The patient voices understanding and is agreeable to proceed.   2. Hyperlipidemia on Crestor- LDL at goal.

## 2019-11-15 ENCOUNTER — Other Ambulatory Visit: Payer: Self-pay

## 2019-11-15 ENCOUNTER — Ambulatory Visit: Payer: 59 | Admitting: Cardiology

## 2019-11-15 ENCOUNTER — Encounter: Payer: Self-pay | Admitting: Cardiology

## 2019-11-15 VITALS — BP 114/70 | HR 75 | Ht 66.0 in | Wt 154.0 lb

## 2019-11-15 DIAGNOSIS — I2510 Atherosclerotic heart disease of native coronary artery without angina pectoris: Secondary | ICD-10-CM

## 2019-11-15 DIAGNOSIS — E785 Hyperlipidemia, unspecified: Secondary | ICD-10-CM

## 2019-11-15 DIAGNOSIS — R9439 Abnormal result of other cardiovascular function study: Secondary | ICD-10-CM

## 2019-11-15 DIAGNOSIS — Z01812 Encounter for preprocedural laboratory examination: Secondary | ICD-10-CM | POA: Diagnosis not present

## 2019-11-15 MED ORDER — NITROGLYCERIN 0.4 MG SL SUBL: 0.4000 mg | SUBLINGUAL_TABLET | SUBLINGUAL | 3 refills | Status: AC | PRN

## 2019-11-16 ENCOUNTER — Other Ambulatory Visit: Payer: Self-pay | Admitting: Cardiology

## 2019-11-16 DIAGNOSIS — I209 Angina pectoris, unspecified: Secondary | ICD-10-CM

## 2019-11-16 MED ORDER — SODIUM CHLORIDE 0.9% FLUSH: 3.0000 mL | Freq: Two times a day (BID) | INTRAVENOUS | Status: DC

## 2019-11-16 NOTE — Patient Instructions (Signed)
Medication Instructions:  Continue same medications *If you need a refill on your cardiac medications before your next appointment, please call your pharmacy*   Lab Work: Cbc,bmet to be done 12/06/19    Testing/Procedures: Cardiac Cath scheduled 12/17/19    Follow instructions below  Follow-Up: At [facility], you and your health needs are our priority.  As part of our continuing mission to provide you with exceptional heart care, we have created designated Provider Care Teams.  These Care Teams include your primary Cardiologist (physician) and Advanced Practice Providers (APPs -  Physician Assistants and Nurse Practitioners) who all work together to provide you with the care you need, when you need it.  We recommend signing up for the patient portal called "MyChart".  Sign up information is provided on this After Visit Summary.  MyChart is used to connect with patients for Virtual Visits (Telemedicine).  Patients are able to view lab/test results, encounter notes, upcoming appointments, etc.  Non-urgent messages can be sent to your provider as well.   To learn more about what you can do with MyChart, go to [email or web address].    Your next appointment:  Tuesday 01/04/20 at 4:20 pm   The format for your next appointment: Office    Provider: Dr.Jordan          [facility] CARDIOVASCULAR DIVISION [facility] [address] [place] [address] Dept: [phone] Loc: [phone]  Bob Adams  11/16/2019  You are scheduled for a Cardiac Cath on Friday 12/17/19, with Dr. Swaziland.  1. Please arrive at the [facility] (Main Entrance A) at [facility]: [address] [place], [place] [address] at 5:30 am (This time is two hours before your procedure to ensure your preparation). Free valet parking service is available.   Special note: Every effort is made to have your procedure done on time. Please understand that  emergencies sometimes delay scheduled procedures.  2. Diet: Do not eat solid foods after midnight.  The patient may have clear liquids until 5am upon the day of the procedure.  3. Labs: You will need to have blood drawn on Monday 7/19 8:00 to 12:00 noon. You do not need to be fasting.  4. Medication instructions in preparation for your procedure:    On the morning of your procedure, take your Aspirin 81 mg and any morning medicines NOT listed above.  You may use sips of water.  5. Plan for one night stay--bring personal belongings. 6. Bring a current list of your medications and current insurance cards. 7. You MUST have a responsible person to drive you home. 8. Someone MUST be with you the first 24 hours after you arrive home or your discharge will be delayed. 9. Please wear clothes that are easy to get on and off and wear slip-on shoes.  Thank you for allowing [place] to care for you!   -- [facility]

## 2019-11-16 NOTE — Addendum Note (Signed)
Addended by: Neoma Laming on: 11/16/2019 01:43 PM   Modules accepted: Orders

## 2019-11-29 ENCOUNTER — Telehealth: Payer: Self-pay | Admitting: Cardiology

## 2019-11-29 NOTE — Telephone Encounter (Signed)
   Pt would like to know what time his procedure on 12/17/2019. He also wanted to know when he needs to get his blood work done. Is it 1 week prior procedure?

## 2019-11-29 NOTE — Telephone Encounter (Signed)
The patient has been made aware and verbalized his understanding.    1. Please arrive at the [facility] (Main Entrance A) at [facility]: [address] [place], [place] [address] at 5:30 am (This time is two hours before your procedure to ensure your preparation). Free valet parking service is available.   Special note: Every effort is made to have your procedure done on time. Please understand that emergencies sometimes delay scheduled procedures.  2. Diet: Do not eat solid foods after midnight.  The patient may have clear liquids until 5am upon the day of the procedure.  3. Labs: You will need to have blood drawn on Monday 7/19 8:00 to 12:00 noon. You do not need to be fasting.

## 2019-12-10 LAB — BASIC METABOLIC PANEL
BUN/Creatinine Ratio: 14 (ref 9–20)
BUN: 11 mg/dL (ref 6–24)
CO2: 26 mmol/L (ref 20–29)
Calcium: 9.6 mg/dL (ref 8.7–10.2)
Chloride: 99 mmol/L (ref 96–106)
Creatinine, Ser: 0.81 mg/dL (ref 0.76–1.27)
GFR calc Af Amer: 112 mL/min/{1.73_m2} (ref >59)
GFR calc non Af Amer: 97 mL/min/{1.73_m2} (ref >59)
Glucose: 93 mg/dL (ref 65–99)
Potassium: 4.7 mmol/L (ref 3.5–5.2)
Sodium: 139 mmol/L (ref 134–144)

## 2019-12-10 LAB — CBC WITH DIFFERENTIAL/PLATELET
Basophils Absolute: 0.1 10*3/uL (ref 0.0–0.2)
Basos: 1 %
EOS (ABSOLUTE): 0.1 10*3/uL (ref 0.0–0.4)
Eos: 1 %
Hematocrit: 43.1 % (ref 37.5–51.0)
Hemoglobin: 13.3 g/dL (ref 13.0–17.7)
Immature Grans (Abs): 0 10*3/uL (ref 0.0–0.1)
Immature Granulocytes: 0 %
Lymphocytes Absolute: 1.7 10*3/uL (ref 0.7–3.1)
Lymphs: 27 %
MCH: 21.2 pg — ABNORMAL LOW (ref 26.6–33.0)
MCHC: 30.9 g/dL — ABNORMAL LOW (ref 31.5–35.7)
MCV: 69 fL — ABNORMAL LOW (ref 79–97)
Monocytes Absolute: 0.5 10*3/uL (ref 0.1–0.9)
Monocytes: 8 %
Neutrophils Absolute: 4.1 10*3/uL (ref 1.4–7.0)
Neutrophils: 63 %
Platelets: 137 10*3/uL — ABNORMAL LOW (ref 150–450)
RBC: 6.26 x10E6/uL — ABNORMAL HIGH (ref 4.14–5.80)
RDW: 13.9 % (ref 11.6–15.4)
WBC: 6.5 10*3/uL (ref 3.4–10.8)

## 2019-12-16 ENCOUNTER — Telehealth: Payer: Self-pay | Admitting: *Deleted

## 2019-12-16 NOTE — Telephone Encounter (Addendum)
Pt contacted pre-catheterization scheduled at [facility] for: Friday December 17, 2019 7:30 AM Verified arrival time and place: [facility] Main Entrance A [facility]) at: 5:30 AM   No solid food after midnight prior to cath, clear liquids until 5 AM day of procedure.   AM meds can be  taken pre-cath with sips of water including: ASA 81 mg   Confirmed patient has responsible adult to drive home post procedure and observe 24 hours after arriving home: yes  You are allowed ONE visitor in the waiting room during your procedure. Both you and your visitor must wear a mask once you enter the hospital.      COVID-19 Pre-Screening Questions:   In the past 14 days have you had a new cough associated with shortness of breath, fever (100.4 or greater) or sudden loss of taste or sense of smell? no  In the past 14 days have you been around anyone with known Covid 19? no  Any international travel in the past 14 days? no  Have you been vaccinated for COVID-19? Yes, see immunization history

## 2019-12-17 ENCOUNTER — Encounter (HOSPITAL_COMMUNITY): Payer: Self-pay | Admitting: Cardiology

## 2019-12-17 ENCOUNTER — Encounter (HOSPITAL_COMMUNITY)
Admission: RE | Disposition: A | Discharge status: Home / Self Care | Payer: Self-pay | Source: Home / Self Care | Attending: Cardiology

## 2019-12-17 ENCOUNTER — Other Ambulatory Visit: Payer: Self-pay

## 2019-12-17 ENCOUNTER — Ambulatory Visit (HOSPITAL_COMMUNITY)
Admission: RE | Admit: 2019-12-17 | Discharge: 2019-12-18 | Disposition: A | Discharge status: Home / Self Care | Payer: 59 | Attending: Cardiology | Admitting: Cardiology

## 2019-12-17 DIAGNOSIS — E785 Hyperlipidemia, unspecified: Secondary | ICD-10-CM | POA: Diagnosis present

## 2019-12-17 DIAGNOSIS — Z7982 Long term (current) use of aspirin: Secondary | ICD-10-CM | POA: Diagnosis not present

## 2019-12-17 DIAGNOSIS — Z955 Presence of coronary angioplasty implant and graft: Secondary | ICD-10-CM | POA: Diagnosis not present

## 2019-12-17 DIAGNOSIS — I2510 Atherosclerotic heart disease of native coronary artery without angina pectoris: Secondary | ICD-10-CM | POA: Diagnosis present

## 2019-12-17 DIAGNOSIS — K219 Gastro-esophageal reflux disease without esophagitis: Secondary | ICD-10-CM | POA: Diagnosis not present

## 2019-12-17 DIAGNOSIS — D509 Iron deficiency anemia, unspecified: Secondary | ICD-10-CM

## 2019-12-17 DIAGNOSIS — I25119 Atherosclerotic heart disease of native coronary artery with unspecified angina pectoris: Secondary | ICD-10-CM | POA: Diagnosis not present

## 2019-12-17 DIAGNOSIS — I252 Old myocardial infarction: Secondary | ICD-10-CM | POA: Diagnosis not present

## 2019-12-17 DIAGNOSIS — Z79899 Other long term (current) drug therapy: Secondary | ICD-10-CM | POA: Diagnosis not present

## 2019-12-17 DIAGNOSIS — D696 Thrombocytopenia, unspecified: Secondary | ICD-10-CM | POA: Diagnosis not present

## 2019-12-17 DIAGNOSIS — I2582 Chronic total occlusion of coronary artery: Secondary | ICD-10-CM | POA: Diagnosis not present

## 2019-12-17 DIAGNOSIS — Z8249 Family history of ischemic heart disease and other diseases of the circulatory system: Secondary | ICD-10-CM | POA: Insufficient documentation

## 2019-12-17 DIAGNOSIS — I209 Angina pectoris, unspecified: Secondary | ICD-10-CM | POA: Diagnosis present

## 2019-12-17 DIAGNOSIS — I2584 Coronary atherosclerosis due to calcified coronary lesion: Secondary | ICD-10-CM | POA: Diagnosis not present

## 2019-12-17 HISTORY — PX: CORONARY ULTRASOUND/IVUS: CATH118244

## 2019-12-17 HISTORY — PX: INTRAVASCULAR ULTRASOUND/IVUS: CATH118244

## 2019-12-17 HISTORY — DX: Other abnormality of red blood cells: R71.8

## 2019-12-17 HISTORY — PX: CORONARY STENT INTERVENTION: CATH118234

## 2019-12-17 HISTORY — PX: LEFT HEART CATH AND CORONARY ANGIOGRAPHY: CATH118249

## 2019-12-17 HISTORY — DX: Alpha thalassemia: D56.0

## 2019-12-17 LAB — POCT ACTIVATED CLOTTING TIME
Activated Clotting Time: 257 seconds
Activated Clotting Time: 274 seconds
Activated Clotting Time: 246 seconds
Activated Clotting Time: 395 seconds
Activated Clotting Time: 417 seconds
Activated Clotting Time: 241 seconds
Activated Clotting Time: 356 seconds

## 2019-12-17 SURGERY — LEFT HEART CATH AND CORONARY ANGIOGRAPHY
Anesthesia: LOCAL

## 2019-12-17 MED ORDER — SODIUM CHLORIDE 0.9 % IV SOLN: 250.0000 mL | INTRAVENOUS | Status: DC | PRN

## 2019-12-17 MED ORDER — MIDAZOLAM HCL 2 MG/2ML IJ SOLN
INTRAMUSCULAR | Status: DC | PRN
  Administered 2019-12-17 (×3): 1 mg via INTRAVENOUS

## 2019-12-17 MED ORDER — SODIUM CHLORIDE 0.9% FLUSH: 3.0000 mL | INTRAVENOUS | Status: DC | PRN

## 2019-12-17 MED ORDER — HYDRALAZINE HCL 20 MG/ML IJ SOLN: 10.0000 mg | INTRAMUSCULAR | Status: AC | PRN

## 2019-12-17 MED ORDER — ASPIRIN 81 MG PO CHEW: 81.0000 mg | CHEWABLE_TABLET | ORAL | Status: DC

## 2019-12-17 MED ORDER — FENTANYL CITRATE (PF) 100 MCG/2ML IJ SOLN
INTRAMUSCULAR | Status: AC
  Filled 2019-12-17: qty 2

## 2019-12-17 MED ORDER — NITROGLYCERIN 1 MG/10 ML FOR IR/CATH LAB
INTRA_ARTERIAL | Status: AC
  Filled 2019-12-17: qty 10

## 2019-12-17 MED ORDER — MIDAZOLAM HCL 2 MG/2ML IJ SOLN
INTRAMUSCULAR | Status: AC
  Filled 2019-12-17: qty 2

## 2019-12-17 MED ORDER — SODIUM CHLORIDE 0.9 % WEIGHT BASED INFUSION: 1.0000 mL/kg/h | INTRAVENOUS | Status: DC

## 2019-12-17 MED ORDER — VERAPAMIL HCL 2.5 MG/ML IV SOLN
INTRAVENOUS | Status: AC
  Filled 2019-12-17: qty 2

## 2019-12-17 MED ORDER — ASPIRIN EC 81 MG PO TBEC
81.0000 mg | DELAYED_RELEASE_TABLET | Freq: Every day | ORAL | Status: DC
  Administered 2019-12-18: 81 mg via ORAL
  Filled 2019-12-17: qty 1

## 2019-12-17 MED ORDER — NITROGLYCERIN 0.4 MG SL SUBL: 0.4000 mg | SUBLINGUAL_TABLET | SUBLINGUAL | Status: DC | PRN

## 2019-12-17 MED ORDER — HEPARIN (PORCINE) IN NACL 1000-0.9 UT/500ML-% IV SOLN
INTRAVENOUS | Status: DC | PRN
  Administered 2019-12-17 (×2): 500 mL

## 2019-12-17 MED ORDER — LIDOCAINE HCL (PF) 1 % IJ SOLN
INTRAMUSCULAR | Status: DC | PRN
  Administered 2019-12-17: 2 mL

## 2019-12-17 MED ORDER — ACETAMINOPHEN 325 MG PO TABS
650.0000 mg | ORAL_TABLET | ORAL | Status: DC | PRN
  Administered 2019-12-17: 650 mg via ORAL
  Filled 2019-12-17: qty 2

## 2019-12-17 MED ORDER — PANTOPRAZOLE SODIUM 40 MG PO TBEC
40.0000 mg | DELAYED_RELEASE_TABLET | Freq: Every day | ORAL | Status: DC
  Administered 2019-12-17 – 2019-12-18 (×2): 40 mg via ORAL
  Filled 2019-12-17 (×2): qty 1

## 2019-12-17 MED ORDER — SODIUM CHLORIDE 0.9% FLUSH: 3.0000 mL | Freq: Two times a day (BID) | INTRAVENOUS | Status: DC

## 2019-12-17 MED ORDER — TICAGRELOR 90 MG PO TABS
ORAL_TABLET | ORAL | Status: AC
  Filled 2019-12-17: qty 2

## 2019-12-17 MED ORDER — HEPARIN (PORCINE) IN NACL 1000-0.9 UT/500ML-% IV SOLN
INTRAVENOUS | Status: AC
  Filled 2019-12-17: qty 1000

## 2019-12-17 MED ORDER — ROSUVASTATIN CALCIUM 20 MG PO TABS
40.0000 mg | ORAL_TABLET | Freq: Every day | ORAL | Status: DC
  Administered 2019-12-17 – 2019-12-18 (×2): 40 mg via ORAL
  Filled 2019-12-17 (×2): qty 2

## 2019-12-17 MED ORDER — HEPARIN SODIUM (PORCINE) 1000 UNIT/ML IJ SOLN
INTRAMUSCULAR | Status: AC
  Filled 2019-12-17: qty 1

## 2019-12-17 MED ORDER — SODIUM CHLORIDE 0.9 % IV SOLN
250.0000 mL | INTRAVENOUS | Status: DC | PRN
  Administered 2019-12-17: 250 mL via INTRAVENOUS

## 2019-12-17 MED ORDER — FENTANYL CITRATE (PF) 100 MCG/2ML IJ SOLN
INTRAMUSCULAR | Status: DC | PRN
  Administered 2019-12-17 (×3): 25 ug via INTRAVENOUS

## 2019-12-17 MED ORDER — SODIUM CHLORIDE 0.9 % WEIGHT BASED INFUSION
3.0000 mL/kg/h | INTRAVENOUS | Status: DC
  Administered 2019-12-17: 3 mL/kg/h via INTRAVENOUS

## 2019-12-17 MED ORDER — NITROGLYCERIN 1 MG/10 ML FOR IR/CATH LAB
INTRA_ARTERIAL | Status: DC | PRN
  Administered 2019-12-17 (×2): 200 ug via INTRACORONARY

## 2019-12-17 MED ORDER — LABETALOL HCL 5 MG/ML IV SOLN: 10.0000 mg | INTRAVENOUS | Status: AC | PRN

## 2019-12-17 MED ORDER — ONDANSETRON HCL 4 MG/2ML IJ SOLN: 4.0000 mg | Freq: Four times a day (QID) | INTRAMUSCULAR | Status: DC | PRN

## 2019-12-17 MED ORDER — IOHEXOL 350 MG/ML SOLN
INTRAVENOUS | Status: DC | PRN
  Administered 2019-12-17: 250 mL

## 2019-12-17 MED ORDER — TICAGRELOR 90 MG PO TABS
90.0000 mg | ORAL_TABLET | Freq: Two times a day (BID) | ORAL | Status: DC
  Administered 2019-12-17 – 2019-12-18 (×2): 90 mg via ORAL
  Filled 2019-12-17 (×2): qty 1

## 2019-12-17 MED ORDER — VERAPAMIL HCL 2.5 MG/ML IV SOLN
INTRAVENOUS | Status: DC | PRN
  Administered 2019-12-17: 10 mL via INTRA_ARTERIAL

## 2019-12-17 MED ORDER — HEPARIN SODIUM (PORCINE) 1000 UNIT/ML IJ SOLN
INTRAMUSCULAR | Status: DC | PRN
  Administered 2019-12-17: 3500 [IU] via INTRAVENOUS
  Administered 2019-12-17: 3000 [IU] via INTRAVENOUS
  Administered 2019-12-17: 2000 [IU] via INTRAVENOUS
  Administered 2019-12-17: 4000 [IU] via INTRAVENOUS
  Administered 2019-12-17: 2000 [IU] via INTRAVENOUS
  Administered 2019-12-17: 3000 [IU] via INTRAVENOUS

## 2019-12-17 MED ORDER — TICAGRELOR 90 MG PO TABS
ORAL_TABLET | ORAL | Status: DC | PRN
  Administered 2019-12-17: 180 mg via ORAL

## 2019-12-17 MED ORDER — LIDOCAINE HCL (PF) 1 % IJ SOLN
INTRAMUSCULAR | Status: AC
  Filled 2019-12-17: qty 30

## 2019-12-17 MED ORDER — SODIUM CHLORIDE 0.9 % WEIGHT BASED INFUSION: 1.0000 mL/kg/h | INTRAVENOUS | Status: AC

## 2019-12-17 SURGICAL SUPPLY — 35 items
ADDWIRE .014X145 (WIRE) ×2
BALLN SAPPHIRE 1.0X10 (BALLOONS) ×2
BALLN SAPPHIRE 2.0X12 (BALLOONS) ×2
BALLN SAPPHIRE ~~LOC~~ 2.5X12 (BALLOONS) ×2 IMPLANT
BALLN SAPPHIRE ~~LOC~~ 2.75X18 (BALLOONS) ×2 IMPLANT
BALLN SAPPHIRE ~~LOC~~ 3.5X18 (BALLOONS) ×2 IMPLANT
BALLOON SAPPHIRE 1.0X10 (BALLOONS) ×1 IMPLANT
BALLOON SAPPHIRE 2.0X12 (BALLOONS) ×1 IMPLANT
CATH 5FR JL3.5 JR4 ANG PIG MP (CATHETERS) ×2 IMPLANT
CATH LAUNCHER 6FR AL.75 (CATHETERS) ×2 IMPLANT
CATH MAMBA 135 (CATHETERS) ×2 IMPLANT
CATH OPTICROSS HD (CATHETERS) ×2 IMPLANT
CATH SHOCKWAVE 2.5X12 (CATHETERS) ×1 IMPLANT
CATH TELESCOPE 6F GEC (CATHETERS) ×2 IMPLANT
CATH TURNPIKE 135CM (CATHETERS) ×2 IMPLANT
CATH VISTA GUIDE 6FR XBLAD3.5 (CATHETERS) ×2 IMPLANT
CATHETER SHOCKWAVE 2.5X12 (CATHETERS) ×2
DEVICE RAD COMP TR BAND LRG (VASCULAR PRODUCTS) ×2 IMPLANT
EXTENSION ADDWIRE .014X145 (WIRE) ×1 IMPLANT
GLIDESHEATH SLEND SS 6F .021 (SHEATH) ×2 IMPLANT
GUIDEWIRE INQWIRE 1.5J.035X260 (WIRE) ×1 IMPLANT
INQWIRE 1.5J .035X260CM (WIRE) ×2
KIT ENCORE 26 ADVANTAGE (KITS) ×2 IMPLANT
KIT HEART LEFT (KITS) ×2 IMPLANT
PACK CARDIAC CATHETERIZATION (CUSTOM PROCEDURE TRAY) ×2 IMPLANT
SHEATH PROBE COVER 6X72 (BAG) ×2 IMPLANT
SLED PULL BACK IVUS (MISCELLANEOUS) ×2 IMPLANT
STENT SYNERGY XD 2.50X48 (Permanent Stent) ×1 IMPLANT
STENT SYNERGY XD 3.0X32 (Permanent Stent) ×2 IMPLANT
SYNERGY XD 2.50X48 (Permanent Stent) ×2 IMPLANT
TRANSDUCER W/STOPCOCK (MISCELLANEOUS) ×2 IMPLANT
TUBING CIL FLEX 10 FLL-RA (TUBING) ×2 IMPLANT
WIRE ASAHI PROWATER 180CM (WIRE) ×2 IMPLANT
WIRE FIGHTER CROSSING 190CM (WIRE) ×2 IMPLANT
WIRE HI TORQ VERSACORE-J 145CM (WIRE) ×2 IMPLANT

## 2019-12-17 NOTE — H&P (Signed)
Cardiology Admission History and Physical:   Patient ID: Bob Adams MRN: [id number]; DOB: 1960-05-24   Admission date: 12/17/2019  Primary Care Provider: Martha Clan, MD [facility] Cardiologist: Bob Cheyney Swaziland MD [facility] Electrophysiologist:  None   Chief Complaint:  Chest pain  Patient Profile:   Bob Adams is a 59 y.o. male with CAD evaluated for chest pain  History of Present Illness:   Bob Adams  is seen today for followup of abnormal ETT.  He is status post stenting of the proximal LAD in 2010 with a drug-eluting stent. He also had 70% RCA disease and 50% LCx disease at that time.  Stress Echo in June 2011 was normal. ETT in Dec. 2017 was normal.  He states he was doing well until this spring. In Bob he began a month long fast for Ramadan. He lost weight which is typical. He did not exercise during this time and really didn't resume exercise for 2 weeks after to regain some strength and weight.  He states that when he resumed exercise he noted a significant difference in his HR. Before he could get his HR up to 160-170. But now HR didn't go over 126. He developed a sensation in the left pectoral region that felt like "an injury that is healing". If he let his HR drop the symptoms resolved. After 10 minute warm up symptoms would improve and he could continue exercise. He feels that something clearly has changed from before. An ETT was ordered. While exercise tolerance was still good it was not as good as before and he did develop new ST depression.       Past Medical History:  Diagnosis Date  . Allergy   . Coronary artery disease 2011   stent  . Dyslipidemia   . GERD (gastroesophageal reflux disease)   . Non Q wave myocardial infarction (HCC) 06/2009   stent  . Right inguinal hernia 11/17/2017    Past Surgical History:  Procedure Laterality Date  . CARDIOVASCULAR STRESS TEST  10/24/2009   EF 60%  . CORONARY STENT PLACEMENT  06/28/2008    SUCCESSFUL INTRACORONARY STENTING OF THE PROXIMAL LEFT ANTERIOR DESCENDING WITH THROMBUS EXTRACTION. EF 65%  . fracture arm    . INGUINAL HERNIA REPAIR Right 11/17/2017   Procedure: OPEN RIGHT INGUINAL HERNIA REPAIR;  Surgeon: Bob Kelp, MD;  Location: [facility];  Service: General;  Laterality: Right;  . INSERTION OF MESH Right 11/17/2017   Procedure: INSERTION OF MESH;  Surgeon: Bob Kelp, MD;  Location: [facility];  Service: General;  Laterality: Right;  . KNEE ARTHROSCOPY       Medications Prior to Admission: Prior to Admission medications   Medication Sig Start Date End Date Taking? Authorizing Provider  aspirin 81 MG tablet Take 81 mg by mouth daily.     Yes [provider]  nitroGLYCERIN (NITROSTAT) 0.4 MG SL tablet Place 1 tablet (0.4 mg total) under the tongue every 5 (five) minutes as needed for chest pain. 11/15/19  Yes Adams, Bob Holcomb M, MD  rosuvastatin (CRESTOR) 40 MG tablet Take 1 tablet (40 mg total) by mouth daily. 04/14/19  Yes Adams, Bob Murata M, MD  omeprazole (PRILOSEC) 20 MG capsule Take 1 capsule (20 mg total) by mouth daily. Patient taking differently: Take 20 mg by mouth daily as needed (reflux).  06/18/18   Adams, April, MD     Allergies:   No Known Allergies  Social History:   Social History  Socioeconomic History  . Marital status: Married    Spouse name: Not on file  . Number of children: 2  . Years of education: Not on file  . Highest education level: Not on file  Occupational History  . Occupation: Garment/textile technologist: [employer]    Comment: [employer]  Tobacco Use  . Smoking status: Never Smoker  . Smokeless tobacco: Never Used  Substance and Sexual Activity  . Alcohol use: No  . Drug use: No  . Sexual activity: Not on file  Other Topics Concern  . Not on file  Social History Narrative  . Not on file   Social Determinants of Health   Financial Resource Strain:   . Difficulty of Paying Living  Expenses:   Food Insecurity:   . Worried About Programme researcher, broadcasting/film/video in the Last Year:   . Barista in the Last Year:   Transportation Needs:   . Freight forwarder (Medical):   Marland Kitchen Lack of Transportation (Non-Medical):   Physical Activity:   . Days of Exercise per Week:   . Minutes of Exercise per Session:   Stress:   . Feeling of Stress :   Social Connections:   . Frequency of Communication with Friends and Family:   . Frequency of Social Gatherings with Friends and Family:   . Attends Religious Services:   . Active Member of Clubs or Organizations:   . Attends Banker Meetings:   Marland Kitchen Marital Status:   Intimate Partner Violence:   . Fear of Current or Ex-Partner:   . Emotionally Abused:   Marland Kitchen Physically Abused:   . Sexually Abused:     Family History:   The patient's family history includes Heart attack in his mother. There is no history of Colon cancer.    ROS:  Please see the history of present illness.  All other ROS reviewed and negative.     Physical Exam/Data:   Vitals:   12/17/19 0547  BP: (!) 127/91  Pulse: 97  Resp: 16  Temp: 98.1 F (36.7 C)  TempSrc: Oral  SpO2: 100%  Weight: 69.9 kg  Height: 5\' 6"  (1.676 m)   No intake or output data in the 24 hours ending 12/17/19 0711 Last 3 Weights 12/17/2019 11/15/2019 04/14/2019  Weight (lbs) 154 lb 154 lb 166 lb 12.8 oz  Weight (kg) 69.854 kg 69.854 kg 75.66 kg     Body mass index is 24.86 kg/m.  General:  Well nourished, well developed, in no acute distress HEENT: normal Lymph: no adenopathy Neck: no JVD Endocrine:  No thryomegaly Vascular: No carotid bruits; FA pulses 2+ bilaterally without bruits  Cardiac:  normal S1, S2; RRR; no murmur  Lungs:  clear to auscultation bilaterally, no wheezing, rhonchi or rales  Abd: soft, nontender, no hepatomegaly  Ext: no edema Musculoskeletal:  No deformities, BUE and BLE strength normal and equal Skin: warm and dry  Neuro:  CNs 2-12 intact, no  focal abnormalities noted Psych:  Normal affect    EKG:  The ECG that was done today was personally reviewed and demonstrates NSR with normal Ecg.   Relevant CV Studies: ETT 11/02/19: Study Highlights   Blood pressure demonstrated a normal response to exercise.  There was 58mm of horizontal to upsloping ST segment depression in the inferolateral leads at peak exercise which became downsloping in recovery.  Positive exercise stress test for ischemia.  The patient exercised for 10 minutes reahcing 107% MPHR.  Excellent  exercise capacity achieving 13.4 mets.  Consider further evaluation with coronary CTA or Nuclear stress testing.   Laboratory Data:  High Sensitivity Troponin:  No results for input(s): TROPONINIHS in the last 720 hours.    Chemistry Recent Labs  Lab 12/10/19 0827  NA 139  K 4.7  CL 99  CO2 26  GLUCOSE 93  BUN 11  CREATININE 0.81  CALCIUM 9.6  GFRNONAA 97  GFRAA 112    No results for input(s): PROT, ALBUMIN, AST, ALT, ALKPHOS, BILITOT in the last 168 hours. Hematology Recent Labs  Lab 12/10/19 0827  WBC 6.5  RBC 6.26*  HGB 13.3  HCT 43.1  MCV 69*  MCH 21.2*  MCHC 30.9*  RDW 13.9  PLT 137*   BNPNo results for input(s): BNP, PROBNP in the last 168 hours.  DDimer No results for input(s): DDIMER in the last 168 hours.   Radiology/Studies:  No results found.         Assessment and Plan:   Coronary disease status post stenting of the proximal LAD in February 2010 with a 3.0 x 15 mm Xience stent. He had residual moderate disease in the LCx and RCA. ETT in Dec. 2017 was normal. He now is experiencing exertional symptoms c/w angina. ETT showed good exercise tolerance but has decreased from before and he has ST depression that is new. Continue current therapy with ASA and statin. Given Rx for sl Ntg. I have recommended he curtail his exercise until we can perform more definitive evaluation with cardiac cath.  The procedure and risks were  reviewed including but not limited to death, myocardial infarction, stroke, arrythmias, bleeding, transfusion, emergency surgery, dye allergy, or renal dysfunction. The patient voices understanding and is agreeable to proceed.   2. Hyperlipidemia on Crestor- LDL at goal.     Severity of Illness: The appropriate patient status for this patient is OBSERVATION. Observation status is judged to be reasonable and necessary in order to provide the required intensity of service to ensure the patient's safety. The patient's presenting symptoms, physical exam findings, and initial radiographic and laboratory data in the context of their medical condition is felt to place them at decreased risk for further clinical deterioration. Furthermore, it is anticipated that the patient will be medically stable for discharge from the hospital within 2 midnights of admission. The following factors support the patient status of observation.      For questions or updates, please contact [facility] Please consult [email or web address] for contact info under     Signed, Alera Quevedo [place], MD  12/17/2019 7:11 AM

## 2019-12-17 NOTE — TOC Benefit Eligibility Note (Signed)
Transition of Care [facility]) Benefit Eligibility Note    Patient Details  Name: Bob Adams MRN: [id number] Date of Birth: 1960/09/29   Medication/Dose: brilinta 90 mg bid  Covered?: Yes     Prescription Coverage Preferred Pharmacy: most major retail pharmacies  Spoke with Person/Company/Phone Number:: [facility]  Co-Pay: $43.71 for 30 day retail/  Prior Approval: No          Orson Aloe Phone Number: 12/17/2019, 4:26 PM

## 2019-12-17 NOTE — Care Management (Signed)
1516 12-17-19 Benefits check submitted for Brilinta. Case Manager will follow for cost. Graves-Bigelow, Lamar Laundry, RN,BSN Case Manager

## 2019-12-17 NOTE — Interval H&P Note (Signed)
History and Physical Interval Note:  12/17/2019 7:15 AM  Bob Adams  has presented today for surgery, with the diagnosis of chest pain.  The various methods of treatment have been discussed with the patient and family. After consideration of risks, benefits and other options for treatment, the patient has consented to  Procedure(s): LEFT HEART CATH AND CORONARY ANGIOGRAPHY (N/A) as a surgical intervention.  The patient's history has been reviewed, patient examined, no change in status, stable for surgery.  I have reviewed the patient's chart and labs.  Questions were answered to the patient's satisfaction.    Cath Lab Visit (complete for each Cath Lab visit)  Clinical Evaluation Leading to the Procedure:   ACS: No.  Non-ACS:    Anginal Classification: CCS II  Anti-ischemic medical therapy: Minimal Therapy (1 class of medications)  Non-Invasive Test Results: Intermediate-risk stress test findings: cardiac mortality 1-3%/year  Prior CABG: No previous CABG       [facility] 12/17/2019 7:16 AM

## 2019-12-18 ENCOUNTER — Encounter (HOSPITAL_COMMUNITY): Payer: Self-pay | Admitting: Cardiology

## 2019-12-18 DIAGNOSIS — I2584 Coronary atherosclerosis due to calcified coronary lesion: Secondary | ICD-10-CM | POA: Diagnosis not present

## 2019-12-18 DIAGNOSIS — D696 Thrombocytopenia, unspecified: Secondary | ICD-10-CM

## 2019-12-18 DIAGNOSIS — I25119 Atherosclerotic heart disease of native coronary artery with unspecified angina pectoris: Secondary | ICD-10-CM | POA: Diagnosis not present

## 2019-12-18 DIAGNOSIS — D509 Iron deficiency anemia, unspecified: Secondary | ICD-10-CM | POA: Diagnosis not present

## 2019-12-18 DIAGNOSIS — I2582 Chronic total occlusion of coronary artery: Secondary | ICD-10-CM | POA: Diagnosis not present

## 2019-12-18 LAB — CBC
HCT: 37.2 % — ABNORMAL LOW (ref 39.0–52.0)
Hemoglobin: 11.2 g/dL — ABNORMAL LOW (ref 13.0–17.0)
MCH: 20.6 pg — ABNORMAL LOW (ref 26.0–34.0)
MCHC: 30.1 g/dL (ref 30.0–36.0)
MCV: 68.5 fL — ABNORMAL LOW (ref 80.0–100.0)
Platelets: 130 10*3/uL — ABNORMAL LOW (ref 150–400)
RBC: 5.43 MIL/uL (ref 4.22–5.81)
RDW: 13.9 % (ref 11.5–15.5)
WBC: 9.2 10*3/uL (ref 4.0–10.5)
nRBC: 0 % (ref 0.0–0.2)

## 2019-12-18 LAB — BASIC METABOLIC PANEL
Anion gap: 7 (ref 5–15)
BUN: 8 mg/dL (ref 6–20)
CO2: 27 mmol/L (ref 22–32)
Calcium: 8.7 mg/dL — ABNORMAL LOW (ref 8.9–10.3)
Chloride: 105 mmol/L (ref 98–111)
Creatinine, Ser: 0.71 mg/dL (ref 0.61–1.24)
GFR calc Af Amer: >60 mL/min (ref >60)
GFR calc non Af Amer: >60 mL/min (ref >60)
Glucose, Bld: 111 mg/dL — ABNORMAL HIGH (ref 70–99)
Potassium: 3.7 mmol/L (ref 3.5–5.1)
Sodium: 139 mmol/L (ref 135–145)

## 2019-12-18 MED ORDER — TICAGRELOR 90 MG PO TABS: 90.0000 mg | ORAL_TABLET | Freq: Two times a day (BID) | ORAL | 11 refills | Status: DC

## 2019-12-18 NOTE — Progress Notes (Signed)
Progress Note  Patient Name: Bob Adams Date of Encounter: 12/18/2019  [facility] Cardiologist: [place]  Subjective   NO CP  No SOB  Inpatient Medications    Scheduled Meds: . aspirin EC  81 mg Oral Daily  . pantoprazole  40 mg Oral Daily  . rosuvastatin  40 mg Oral Daily  . sodium chloride flush  3 mL Intravenous Q12H  . ticagrelor  90 mg Oral BID   Continuous Infusions: . sodium chloride     PRN Meds: sodium chloride, acetaminophen, nitroGLYCERIN, ondansetron (ZOFRAN) IV, sodium chloride flush   Vital Signs    Vitals:   12/17/19 1312 12/17/19 2011 12/18/19 0036 12/18/19 0628  BP: 111/69 (!) 112/58 116/73 (!) 112/95  Pulse: 82  75 93  Resp: 20 18 18 20   Temp: 97.9 F (36.6 C) 98.7 F (37.1 C) 98.6 F (37 C) 98.4 F (36.9 C)  TempSrc: Oral Oral Oral Oral  SpO2: 100% 100% 100% 100%  Weight:    70.4 kg  Height:        Intake/Output Summary (Last 24 hours) at 12/18/2019 0801 Last data filed at 12/17/2019 2000 Gross per 24 hour  Intake 360 ml  Output --  Net 360 ml   Last 3 Weights 12/18/2019 12/17/2019 11/15/2019  Weight (lbs) 155 lb 3.2 oz 154 lb 154 lb  Weight (kg) 70.398 kg 69.854 kg 69.854 kg      Telemetry    SR - Personally Reviewed  ECG    No new - Personally Reviewed  Physical Exam   GEN: No acute distress.   Neck: No JVD Cardiac: RRR, no murmurs R wrist:  No swelling   Mild bruising   Respiratory: Clear to auscultation bilaterally. GI: Soft, nontender, non-distended  MS: No edema; No deformity. Neuro:  Nonfocal  Psych: Normal affect   Labs    High Sensitivity Troponin:  No results for input(s): TROPONINIHS in the last 720 hours.    Chemistry Recent Labs  Lab 12/18/19 0238  NA 139  K 3.7  CL 105  CO2 27  GLUCOSE 111*  BUN 8  CREATININE 0.71  CALCIUM 8.7*  GFRNONAA >60  GFRAA >60  ANIONGAP 7     Hematology Recent Labs  Lab 12/18/19 0238  WBC 9.2  RBC 5.43  HGB 11.2*  HCT 37.2*  MCV 68.5*  MCH 20.6*   MCHC 30.1  RDW 13.9  PLT 130*    BNPNo results for input(s): BNP, PROBNP in the last 168 hours.   DDimer No results for input(s): DDIMER in the last 168 hours.   Radiology    CARDIAC CATHETERIZATION  Result Date: 12/17/2019  Prox LAD to Mid LAD lesion is 25% stenosed.  Prox LAD lesion is 90% stenosed.  Mid LAD lesion is 50% stenosed.  A drug-eluting stent was successfully placed using a SYNERGY XD 3.0X32.  Post intervention, there is a 0% residual stenosis.  1st Mrg lesion is 40% stenosed.  Prox RCA lesion is 50% stenosed.  Prox RCA to Mid RCA lesion is 100% stenosed.  A drug-eluting stent was successfully placed using a SYNERGY XD 2.50X48.  Post intervention, there is a 0% residual stenosis.  Mid RCA to Dist RCA lesion is 100% stenosed.  Post intervention, there is a 0% residual stenosis.  Post intervention, there is a 0% residual stenosis.  Post intervention, there is a 0% residual stenosis.  The left ventricular systolic function is normal.  LV end diastolic pressure is normal.  The  left ventricular ejection fraction is 55-65% by visual estimate.  1. Severe 2 vessel obstructive CAD    -90% proximal LAD at prior stent margin. 50% stenosis distal to stent with severe stenosis by IVUS with 360 degree ring of calcification.    - 100% CTO of the mid RCA. 2. Normal LV function. 3. Normal LVEDP 4. Successful CTO PCI of the mid RCA. This restored flow into a large RV marginal branch. DES x 1 with 2.5 x 48 mm Synergy stent post dilated to 2.75 mm. The terminal RCA remained occluded with collateral flow but this only involves a rather small PL branch. The inferior wall is supplied by a wrap around LAD 5. Successful PCI of the proximal to mid LAD with Shock wave therapy for the severely calcified mid lesion. DES x 1 with 3.0 x 32 mm Onyx post dilated to 3.5 mm Plan: DAPT for at least one year. I would favor long term DAPT since he has multiple stent layers in the LAD and a very long stent in  the RCA. Will monitor overnight. Anticipate DC in am.   Cardiac Studies   CATH    Prox LAD to Mid LAD lesion is 25% stenosed.  Prox LAD lesion is 90% stenosed.  Mid LAD lesion is 50% stenosed.  A drug-eluting stent was successfully placed using a SYNERGY XD 3.0X32.  Post intervention, there is a 0% residual stenosis.  1st Mrg lesion is 40% stenosed.  Prox RCA lesion is 50% stenosed.  Prox RCA to Mid RCA lesion is 100% stenosed.  A drug-eluting stent was successfully placed using a SYNERGY XD 2.50X48.  Post intervention, there is a 0% residual stenosis.  Mid RCA to Dist RCA lesion is 100% stenosed.  Post intervention, there is a 0% residual stenosis.  Post intervention, there is a 0% residual stenosis.  Post intervention, there is a 0% residual stenosis.  The left ventricular systolic function is normal.  LV end diastolic pressure is normal.  The left ventricular ejection fraction is 55-65% by visual estimate.   1. Severe 2 vessel obstructive CAD    -90% proximal LAD at prior stent margin. 50% stenosis distal to stent with severe stenosis by IVUS with 360 degree ring of calcification.    - 100% CTO of the mid RCA.  2. Normal LV function.  3. Normal LVEDP 4. Successful CTO PCI of the mid RCA. This restored flow into a large RV marginal branch. DES x 1 with 2.5 x 48 mm Synergy stent post dilated to 2.75 mm. The terminal RCA remained occluded with collateral flow but this only involves a rather small PL branch. The inferior wall is supplied by a wrap around LAD 5. Successful PCI of the proximal to mid LAD with Shock wave therapy for the severely calcified mid lesion. DES x 1 with 3.0 x 32 mm Onyx post dilated to 3.5 mm  Plan: DAPT for at least one year. I would favor long term DAPT since he has multiple stent layers in the LAD and a very long stent in the RCA. Will monitor overnight. Anticipate DC in am.   Patient Profile     59 y.o. male with Hx of CAD    Admitted for CP   Assessment & Plan    1  CAD   Pt is s/p PTCA/stent to LAD in 2010   Pt noted something different in exercise ETT showed ST changes   Admitted for cath Cth as noted above Plan on DAPT,  probably long term WIll need optimimzation of lipids    Pt has very good diet  Motivated. Will need Lipomed panel as outpt  WOuld benefit from Repatha   2  HL  LDL in 2020 was 73    WOuld recommend Lipomed panel to further evaluate lipid particles, proteins.   WOuld benefit from Repatha given LDL 73 and age/aggressiveness of disease.  For questions or updates, please contact [facility] Please consult [email or web address] for contact info under        Signed, Dietrich Pates, MD  12/18/2019, 8:01 AM

## 2019-12-18 NOTE — Discharge Summary (Signed)
Discharge Summary    Patient ID: Bob Adams MRN: [id number]; DOB: 24-Nov-1960  Admit date: 12/17/2019 Discharge date: 12/18/2019  Primary Care Provider: Martha Clan, MD  Primary Cardiologist: No primary care provider on file.  Primary Electrophysiologist:  None   Discharge Diagnoses    Principal Problem:   Angina pectoris [facility]) Active Problems:   Coronary artery disease   Dyslipidemia   Microcytic anemia   Thrombocytopenia [facility])    Diagnostic Studies/Procedures    LHC 12/17/19 Conclusion    Prox LAD to Mid LAD lesion is 25% stenosed.  Prox LAD lesion is 90% stenosed.  Mid LAD lesion is 50% stenosed.  A drug-eluting stent was successfully placed using a SYNERGY XD 3.0X32.  Post intervention, there is a 0% residual stenosis.  1st Mrg lesion is 40% stenosed.  Prox RCA lesion is 50% stenosed.  Prox RCA to Mid RCA lesion is 100% stenosed.  A drug-eluting stent was successfully placed using a SYNERGY XD 2.50X48.  Post intervention, there is a 0% residual stenosis.  Mid RCA to Dist RCA lesion is 100% stenosed.  Post intervention, there is a 0% residual stenosis.  Post intervention, there is a 0% residual stenosis.  Post intervention, there is a 0% residual stenosis.  The left ventricular systolic function is normal.  LV end diastolic pressure is normal.  The left ventricular ejection fraction is 55-65% by visual estimate.   1. Severe 2 vessel obstructive CAD    -90% proximal LAD at prior stent margin. 50% stenosis distal to stent with severe stenosis by IVUS with 360 degree ring of calcification.    - 100% CTO of the mid RCA.  2. Normal LV function.  3. Normal LVEDP 4. Successful CTO PCI of the mid RCA. This restored flow into a large RV marginal branch. DES x 1 with 2.5 x 48 mm Synergy stent post dilated to 2.75 mm. The terminal RCA remained occluded with collateral flow but this only involves a rather small PL branch. The inferior wall is  supplied by a wrap around LAD 5. Successful PCI of the proximal to mid LAD with Shock wave therapy for the severely calcified mid lesion. DES x 1 with 3.0 x 32 mm Onyx post dilated to 3.5 mm  Plan: DAPT for at least one year. I would favor long term DAPT since he has multiple stent layers in the LAD and a very long stent in the RCA. Will monitor overnight. Anticipate DC in am.    _____________   History of Present Illness     Bob Adams is a 59 y.o. male with CAD with NSTEMI s/p DES to prox LAD 2010, dyslipidemia, GERD, alpha thalassemia who presented to the hospital for planned cath.  He has a history of PCI as above, with previously known 70% RCA disease and 50% LCx disease at that time. Stress Echo in June 2011 was normal. ETT in Dec. 2017 was normal. He recently presented to the office with complaints of chest discomfort and blunted HR response with exercise.  An ETT was performed 11/02/19. While exercise tolerance was still good it was not as good as before and he did develop new ST depression. Therefore elective cardiac cath was arranged.  Hospital Course     Results are outlined above with 90% prox LAD at prior stent margin, 50% stenosis distal to stent with severe stenosis by IVUS, 100% CTO of mRCA, normal LVEF/LVEDP. He udnerwent successful CTO PCI of the mid RCA which restored flow into  a large RV marginal branch. DES x 1 with 2.5 x 48 mm Synergy stent post dilated to 2.75 mm. The terminal RCA remained occluded with collateral flow but this only involves a rather small PL branch. The inferior wall is supplied by a wrap around LAD. He also underwent PCI of the prox-mid LAD with shockwave therapy and DESx1. Dr. [place] advised DAPT for at least 1 year and furthermore favors long term DAPT since he has multiple stent layers in the LAD and a very long stent in the RCA. He did well post-cath. His labs did demonstrate mild microcytic anemia/thrombocytopenia - the microcytosis and  borderline platelet count was noted on previous labs as well. I discussed with the patient and he said he was previously informed that he has alpha thalassemia. Regarding lipids, LDL in 2020 was 73. Dr. Tenny Crawoss recommends obtaining Lipomed profile panel at time of follow-up to evaluate lipid particles and proteins and consider Repatha given LDL and age/aggressiveness of disease. Per d/w care management he did not qualify for 30 day free card (weekend) but was provided a copay discount card. Dr. Tenny Crawoss has seen and examined the patient today and feels he is stable for discharge. He already has post-cath f/u 8/17 with Dr. [place]. He is an Art gallery managerengineer. Per d/w Dr. Tenny Crawoss he would be able to go back as early as end of next week if desired but the patient has started he already told his team he will return after 1 weeks' time on 8/9. This is ok with us and a work note was provided. It also appears that prolonged fluoroscopy instructions were automatically dispatched to the patient's AVS.  Did the patient have an acute coronary syndrome (MI, NSTEMI, STEMI, etc) this admission?:  No                               Did the patient have a percutaneous coronary intervention (stent / angioplasty)?:  Yes.     Cath/PCI Registry Performance & Quality Measures: 1. Aspirin prescribed? - Yes 2. ADP Receptor Inhibitor (Plavix/Clopidogrel, Brilinta/Ticagrelor or Effient/Prasugrel) prescribed (includes medically managed patients)? - Yes 3. High Intensity Statin (Lipitor 40-80mg  or Crestor 20-40mg ) prescribed? - Yes 4. For EF <40%, was ACEI/ARB prescribed? - Not Applicable (EF >/= 40%) 5. For EF <40%, Aldosterone Antagonist (Spironolactone or Eplerenone) prescribed? - Not Applicable (EF >/= 40%) 6. Cardiac Rehab Phase II ordered? - Yes   _____________  Discharge Vitals Blood pressure (!) 112/95, pulse 93, temperature 98.4 F (36.9 C), temperature source Oral, resp. rate 20, height 5\' 6"  (1.676 m), weight 70.4 kg, SpO2 100 %.    Filed Weights   12/17/19 0547 12/18/19 0628  Weight: 69.9 kg 70.4 kg    Labs & Radiologic Studies    CBC Recent Labs    12/18/19 0238  WBC 9.2  HGB 11.2*  HCT 37.2*  MCV 68.5*  PLT 130*   Basic Metabolic Panel Recent Labs    16/02/9606/31/21 0238  NA 139  K 3.7  CL 105  CO2 27  GLUCOSE 111*  BUN 8  CREATININE 0.71  CALCIUM 8.7*  _____________  CARDIAC CATHETERIZATION  Result Date: 12/17/2019  Prox LAD to Mid LAD lesion is 25% stenosed.  Prox LAD lesion is 90% stenosed.  Mid LAD lesion is 50% stenosed.  A drug-eluting stent was successfully placed using a SYNERGY XD 3.0X32.  Post intervention, there is a 0% residual stenosis.  1st Mrg lesion is  40% stenosed.  Prox RCA lesion is 50% stenosed.  Prox RCA to Mid RCA lesion is 100% stenosed.  A drug-eluting stent was successfully placed using a SYNERGY XD 2.50X48.  Post intervention, there is a 0% residual stenosis.  Mid RCA to Dist RCA lesion is 100% stenosed.  Post intervention, there is a 0% residual stenosis.  Post intervention, there is a 0% residual stenosis.  Post intervention, there is a 0% residual stenosis.  The left ventricular systolic function is normal.  LV end diastolic pressure is normal.  The left ventricular ejection fraction is 55-65% by visual estimate.  1. Severe 2 vessel obstructive CAD    -90% proximal LAD at prior stent margin. 50% stenosis distal to stent with severe stenosis by IVUS with 360 degree ring of calcification.    - 100% CTO of the mid RCA. 2. Normal LV function. 3. Normal LVEDP 4. Successful CTO PCI of the mid RCA. This restored flow into a large RV marginal branch. DES x 1 with 2.5 x 48 mm Synergy stent post dilated to 2.75 mm. The terminal RCA remained occluded with collateral flow but this only involves a rather small PL branch. The inferior wall is supplied by a wrap around LAD 5. Successful PCI of the proximal to mid LAD with Shock wave therapy for the severely calcified mid lesion. DES x  1 with 3.0 x 32 mm Onyx post dilated to 3.5 mm Plan: DAPT for at least one year. I would favor long term DAPT since he has multiple stent layers in the LAD and a very long stent in the RCA. Will monitor overnight. Anticipate DC in am.  Disposition   Pt is being discharged home today in good condition.  Follow-up Plans & Appointments     Follow-up Information    [place], Peter M, MD Follow up.   Specialty: Cardiology Why: [facility] - keep follow-up appointment as listed below this on Tuesday January 04, 2020 at 4:20 PM (Arrive by 4:05 PM). Contact information: [address] [place] [address] [phone]        Martha Clan, MD Follow up.   Specialty: Internal Medicine Why: Your labwork showed that you have very mild anemia and low platelet count, with a borderline low MCV level. Please follow up with your primary care doctor to monitor this. Contact information: [address] [place] [address] [phone]              Discharge Instructions    Amb Referral to Cardiac Rehabilitation   Complete by: As directed    Diagnosis:  Coronary Stents PTCA     After initial evaluation and assessments completed: Virtual Based Care may be provided alone or in conjunction with Phase 2 Cardiac Rehab based on patient barriers.: Yes   Diet - low sodium heart healthy   Complete by: As directed    Discharge instructions   Complete by: As directed    You were started on a new blood thinner called Brilinta/ticagrelor to take in addition to aspirin. If you notice any bleeding such as blood in stool, black tarry stools, blood in urine, nosebleeds or any other unusual bleeding, call your doctor immediately. It is not normal to have this kind of bleeding while on a blood thinner and usually indicates there is an underlying problem with one of your body systems that needs to be checked out.   Increase activity slowly   Complete by: As directed    No driving for  2  days. No lifting over 5 lbs for 1 week. No sexual activity for 1 week. You may return to work on 12/27/19. Keep procedure site clean & dry. If you notice increased pain, swelling, bleeding or pus, call/return!  You may shower, but no soaking baths/hot tubs/pools for 1 week.      Discharge Medications   Allergies as of 12/18/2019   No Known Allergies     Medication List    TAKE these medications   aspirin 81 MG tablet Take 81 mg by mouth daily.   nitroGLYCERIN 0.4 MG SL tablet Commonly known as: NITROSTAT Place 1 tablet (0.4 mg total) under the tongue every 5 (five) minutes as needed for chest pain.   omeprazole 20 MG capsule Commonly known as: PRILOSEC Take 20 mg by mouth daily as needed (acid reflux).   rosuvastatin 40 MG tablet Commonly known as: CRESTOR Take 1 tablet (40 mg total) by mouth daily.   ticagrelor 90 MG Tabs tablet Commonly known as: BRILINTA Take 1 tablet (90 mg total) by mouth 2 (two) times daily.          Outstanding Labs/Studies   N/a  Duration of Discharge Encounter   Greater than 30 minutes including physician time.  Signed, Laurann Montana, PA-C 12/18/2019, 9:37 AM

## 2019-12-18 NOTE — Care Management (Signed)
0086 12-18-19 Brilinta discount card provided to patient. Patient is aware of co pay cost for Brilinta. Gala Lewandowsky, RN,BSN Case Manager

## 2019-12-18 NOTE — Progress Notes (Addendum)
CARDIAC REHAB PHASE I   PRE:  Rate/Rhythm: 80 SR    BP: sitting 124/80    SaO2:   MODE:  Ambulation: 1400 ft   POST:  Rate/Rhythm: 100 ST    BP: sitting 108/71     SaO2:   Tolerated very well. Pt is used to pushing himself with exercise (elliptical and runs treadmill). No c/o except being anxious due to the surprise of his CAD progression. Long discussion of education and finding balance/stress relief. Understands importance of Brilinta, restrictions, diet, exercise, NTG, and CRPII. Will refer to [place] CRPII, probably most interested in virtual option since he does exercise.  Pt is interested in participating in Virtual Cardiac and Pulmonary Rehab. Pt advised that Virtual Cardiac and Pulmonary Rehab is provided at no cost to the patient.  Checklist:  1. Pt has smart device  ie smartphone and/or ipad for downloading an app  Yes 2. Reliable internet/wifi service    Yes 3. Understands how to use their smartphone and navigate within an app.  Yes Pt verbalized understanding and is in agreement.  [id number]   Harriet Masson CES, ACSM 12/18/2019 8:50 AM

## 2019-12-20 MED FILL — Nitroglycerin IV Soln 100 MCG/ML in D5W: INTRA_ARTERIAL | Qty: 10 | Status: AC

## 2019-12-22 ENCOUNTER — Telehealth (HOSPITAL_COMMUNITY): Payer: Self-pay

## 2019-12-22 NOTE — Telephone Encounter (Signed)
Called patient to see if he is interested in the Cardiac Rehab Program. Patient stated not at this time.

## 2019-12-22 NOTE — Telephone Encounter (Signed)
Pt insurance is active and benefits verified through [facility]. Co-pay $0.00, DED $500.00/$13.75 met, out of pocket $3,000.00/$224.78 met, co-insurance 15%. No pre-authorization required. Passport, 12/22/19 @ 3:55PM, MZT#[id number]  Will contact patient to see if he is interested in the Cardiac Rehab Program. If interested, patient will need to complete follow up appt. Once completed, patient will be contacted for scheduling upon review by the RN Navigator.

## 2019-12-31 NOTE — Progress Notes (Signed)
Bob Adams Date of Birth: 27-Mar-1961   History of Present Illness: Bob Adams is seen today for followup of abnormal ETT.  He is status post stenting of the proximal LAD in 2010 with a drug-eluting stent. He also had 70% RCA disease and 50% LCx disease at that time.  Stress Echo in June 2011 was normal. ETT in Dec. 2017 was normal.  He states he was doing well until about 3-4 weeks ago. In April he began a month long fast for Ramadan. He lost weight which is typical. He did not exercise during this time and really didn't resume exercise for 2 weeks after to regain some strength and weight.  He states that when he resumed exercise he noted a significant difference in his HR. Before he could get his HR up to 160-170. But now HR didn't go over 126. He developed a sensation in the left pectoral region that felt like "an injury that is healing". If he let his HR drop the symptoms resolved. After 10 minute warm up symptoms would improve and he could continue exercise. He feels that something clearly has changed from before. An ETT was ordered. While exercise tolerance was still good it was not as good as before and he did develop new ST depression.     He subsequently underwent cardiac cath showing severe 2 vessel CAD with  Progressive LAD disease just proximal to the old stent and there was a calcified lesion distal to the stent in the mid LAD. This was successfully treated with DES following Shock wave therapy. The RCA had a new CTO in the mid vessel. This was successfully reopened and stented with DES. The PL branch remained occluded with good collaterals.   On follow up today he is feeling well. Denies any chest pain or SOB. Hasn't really pushed it yet with his exercise. No complications from cath.     Current Outpatient Medications on File Prior to Visit  Medication Sig Dispense Refill  . aspirin 81 MG tablet Take 81 mg by mouth daily.      . nitroGLYCERIN (NITROSTAT) 0.4 MG SL tablet Place  1 tablet (0.4 mg total) under the tongue every 5 (five) minutes as needed for chest pain. 25 tablet 3  . omeprazole (PRILOSEC) 20 MG capsule Take 20 mg by mouth daily as needed (acid reflux).    . rosuvastatin (CRESTOR) 40 MG tablet Take 1 tablet (40 mg total) by mouth daily. 90 tablet 3  . ticagrelor (BRILINTA) 90 MG TABS tablet Take 1 tablet (90 mg total) by mouth 2 (two) times daily. 60 tablet 11   No current facility-administered medications on file prior to visit.    No Known Allergies  Past Medical History:  Diagnosis Date  . Allergy   . Alpha thalassemia (HCC)   . Coronary artery disease    a.  s/p DES to prox LAD 2010. b. s/p CTO PCI of RCA and DES to LAD in 11/2019.  Marland Kitchen Dyslipidemia   . GERD (gastroesophageal reflux disease)   . Microcytosis    mild anemia/thrombocytopenia noted on labs with microcytosis  . Non Q wave myocardial infarction (HCC) 06/2008   stent  . Right inguinal hernia 11/17/2017    Past Surgical History:  Procedure Laterality Date  . CARDIOVASCULAR STRESS TEST  10/24/2009   EF 60%  . CORONARY STENT INTERVENTION N/A 12/17/2019   Procedure: CORONARY STENT INTERVENTION;  Surgeon: Swaziland, Corrado Hymon M, MD;  Location: [facility] INVASIVE CV LAB;  Service: Cardiovascular;  Laterality: N/A;  . CORONARY STENT PLACEMENT  06/28/2008   SUCCESSFUL INTRACORONARY STENTING OF THE PROXIMAL LEFT ANTERIOR DESCENDING WITH THROMBUS EXTRACTION. EF 65%  . fracture arm    . INGUINAL HERNIA REPAIR Right 11/17/2017   Procedure: OPEN RIGHT INGUINAL HERNIA REPAIR;  Surgeon: Claud KelpIngram, Haywood, MD;  Location: [facility];  Service: General;  Laterality: Right;  . INSERTION OF MESH Right 11/17/2017   Procedure: INSERTION OF MESH;  Surgeon: Claud KelpIngram, Haywood, MD;  Location: [facility];  Service: General;  Laterality: Right;  . INTRAVASCULAR ULTRASOUND/IVUS N/A 12/17/2019   Procedure: Intravascular Ultrasound/IVUS;  Surgeon: SwazilandJordan, Nefertari Rebman M, MD;  [facility];   Service: Cardiovascular;  Laterality: N/A;  . KNEE ARTHROSCOPY    . 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  Family History  Problem Relation Age of Onset  . Heart attack Mother   . Colon cancer Neg Hx     Review of Systems:  All other systems were reviewed and are negative.  Physical Exam: BP 107/62   Pulse 79   Ht 5\' 6"  (1.676 m)   Wt 157 lb 9.6 oz (71.5 kg)   SpO2 98%   BMI 25.44 kg/m  GENERAL:  Well appearing male in NAD HEENT:  PERRL, EOMI, sclera are clear. Oropharynx is clear. NECK:  No jugular venous distention, carotid upstroke brisk and symmetric, no bruits, no thyromegaly or adenopathy LUNGS:  Clear to auscultation bilaterally CHEST:  Unremarkable HEART:  RRR,  PMI not displaced or sustained,S1 and S2 within normal limits, no S3, no S4: no clicks, no rubs, no murmurs ABD:  Soft, nontender. BS +, no masses or bruits. No hepatomegaly, no splenomegaly EXT:  2 + pulses throughout, no edema, no cyanosis no clubbing SKIN:  Warm and dry.  No rashes NEURO:  Alert and oriented x 3. Cranial nerves II through XII intact. PSYCH:  Cognitively intact   LABORATORY DATA:  Dated 06/09/15: cholesterol 117, triglycerides 77, LDL 71, HDL 31. A1c 4.5%. CMET, CBC, TSH normal Dated 06/14/16: cholesterol 117, triglycerides 65, HDL 41, LDL 63, A1c 4.3%. Hgb 12.3. Chemistries normal. Dated 06/20/17: cholesterol 101, triglycerides 64, HDL 37, LDL 51, plts 131K. T. Bili 2.6. Other chemistries and CBC normal. TSH normal. Dated 06/26/18: cholesterol 124, triglycerides 73, HDL 36, LDL 73. A1c 4.5%. plts 128K.  Other CMET, CBC, TSH normal.  Dated 07/14/19: cholesterol 124, triglycerides 55, HDL 44, LDL 69. A1c 4.7%. Normal CMET and CBC.      ETT 05/15/16:Study Highlights    There was no ST segment deviation noted during stress.   ETT with excellent exercise tolerance (14:01); no chest pain; normal BP response; no ST changes; negative adequate ETT.   Stress Findings   ECG Baseline ECG exhibits normal sinus rhythm..    Stress Findings The patient exercised following the Bruce protocol.  The patient reported no symptoms during the stress test. The patient experienced no angina during the stress test.   The test was stopped because the patient complained of fatigue.   Blood pressure and heart rate demonstrated a  normal response to exercise. Overall, the patient's exercise capacity was excellent.   85% of maximum heart rate was achieved after 8 minutes. Recovery time: 15 minutes.  Duke Treadmill Score: intermediate risk The patient's response to exercise was adequate for diagnosis.    Response to Stress There was no ST segment deviation noted during stress.  Arrhythmias during stress: none.  Arrhythmias during recovery: none.  There were no significant arrhythmias noted during the test.  ECG was interpretable and there was no significant change from baseline.    Stress Measurements   Baseline Vitals  Rest HR 85 bpm    Rest BP 117/74 mmHg    Exercise Time  Exercise duration (min) 14 min    Exercise duration (sec) 1 sec    Peak Stress Vitals  Peak HR 190 bpm    Peak BP 129/67 mmHg    Exercise Data  MPHR 165 bpm    Percent HR 115 %    RPE 16     Estimated workload 17.2 METS       ETT 11/02/19: Study Highlights   Blood pressure demonstrated a normal response to exercise.  There was 63mm of horizontal to upsloping ST segment depression in the inferolateral leads at peak exercise which became downsloping in recovery.  Positive exercise stress test for ischemia.  The  patient exercised for 10 minutes reahcing 107% MPHR.  Excellent exercise capacity achieving 13.4 mets.  Consider further evaluation with coronary CTA or Nuclear stress testing.  Cardiac cath 12/17/19:  CORONARY STENT INTERVENTION  Intravascular Ultrasound/IVUS  LEFT HEART CATH AND CORONARY ANGIOGRAPHY  Conclusion    Prox LAD to Mid LAD lesion is 25% stenosed.  Prox LAD lesion is 90% stenosed.  Mid LAD lesion is 50% stenosed.  A drug-eluting stent was successfully placed using a SYNERGY XD 3.0X32.  Post intervention, there is a 0% residual stenosis.  1st Mrg lesion is 40% stenosed.  Prox RCA lesion is 50% stenosed.  Prox RCA to Mid RCA lesion is 100% stenosed.  A drug-eluting stent was successfully placed using a SYNERGY XD 2.50X48.  Post intervention, there is a 0% residual stenosis.  Mid RCA to Dist RCA lesion is 100% stenosed.  Post intervention, there is a 0% residual stenosis.  Post intervention, there is a 0% residual stenosis.  Post intervention, there is a 0% residual stenosis.  The left ventricular systolic function is normal.  LV end diastolic pressure is normal.  The left ventricular ejection fraction is 55-65% by visual estimate.   1. Severe 2 vessel obstructive CAD    -90% proximal LAD at prior stent margin. 50% stenosis distal to stent with severe stenosis by IVUS with 360 degree ring of calcification.    - 100% CTO of the mid RCA.  2. Normal LV function.  3. Normal LVEDP 4. Successful CTO PCI of the mid RCA. This restored flow into a large RV marginal branch. DES x 1 with 2.5 x 48 mm Synergy stent post dilated to 2.75 mm. The terminal RCA remained occluded with collateral flow but this only involves a rather small PL branch. The inferior wall is supplied by a wrap around LAD 5. Successful PCI of the proximal to mid LAD with Shock wave therapy for the severely calcified mid lesion. DES x 1 with 3.0 x 32 mm Onyx post dilated to 3.5 mm  Plan: DAPT  for at least one year. I would favor long term DAPT since he has multiple stent layers in the LAD and  a very long stent in the RCA. Will monitor overnight. Anticipate DC in am.    Assessment / Plan: 1. Coronary disease status post stenting of the proximal LAD in February 2010 with a 3.0 x 15 mm Xience stent. He had residual moderate disease in the LCx and RCA. ETT in Dec. 2017 was normal. He presented with  exertional symptoms c/w angina. ETT showed good exercise tolerance but has decreased from before and he has ST depression that is new. Cardiac cath showed severe stenosis in the proximal and mid LAD and CTO of the mid RCA. He underwent successful DES of the LAD. The CTO of the RCA was opened and stented establishing flow in a large RV branch. A relatively small PL branch was occluded with collaterals. The LAD wraps around the apex and supplies the inferior wall so there was no significant PDA coming off the RCA. He will remain on DAPT indefinitely. OK to resume exercise. Will follow up in 3 months.   2. Hyperlipidemia on Crestor- LDL last 69 on maximal dose Crestor. Given his premature CAD and very high CV risk I think we need to target a more aggressive lipid goal of LDL less than 50-55%. Will update lipid panel now. Refer to pharmacy to see about initiating PCSK 9 inhibitor.

## 2020-01-04 ENCOUNTER — Encounter: Payer: Self-pay | Admitting: Cardiology

## 2020-01-04 ENCOUNTER — Other Ambulatory Visit: Payer: Self-pay

## 2020-01-04 ENCOUNTER — Ambulatory Visit: Payer: 59 | Admitting: Cardiology

## 2020-01-04 VITALS — BP 107/62 | HR 79 | Ht 66.0 in | Wt 157.6 lb

## 2020-01-04 DIAGNOSIS — I209 Angina pectoris, unspecified: Secondary | ICD-10-CM | POA: Diagnosis not present

## 2020-01-04 DIAGNOSIS — I2510 Atherosclerotic heart disease of native coronary artery without angina pectoris: Secondary | ICD-10-CM

## 2020-01-04 DIAGNOSIS — E785 Hyperlipidemia, unspecified: Secondary | ICD-10-CM

## 2020-01-04 NOTE — Patient Instructions (Signed)
Medication Instructions:  Continue same medications *If you need a refill on your cardiac medications before your next appointment, please call your pharmacy*   Lab Work: None ordered    Testing/Procedures: None ordered   Follow-Up: At [facility], you and your health needs are our priority.  As part of our continuing mission to provide you with exceptional heart care, we have created designated Provider Care Teams.  These Care Teams include your primary Cardiologist (physician) and Advanced Practice Providers (APPs -  Physician Assistants and Nurse Practitioners) who all work together to provide you with the care you need, when you need it.  We recommend signing up for the patient portal called "MyChart".  Sign up information is provided on this After Visit Summary.  MyChart is used to connect with patients for Virtual Visits (Telemedicine).  Patients are able to view lab/test results, encounter notes, upcoming appointments, etc.  Non-urgent messages can be sent to your provider as well.   To learn more about what you can do with MyChart, go to [email or web address].    Your next appointment: Monday 04/10/20 at 4:20 pm   The format for your next appointment: Office   Provider: Dr.Jordan  Schedule appointment with Pharmacist

## 2020-01-17 LAB — LIPID PANEL
Chol/HDL Ratio: 2.4 ratio (ref 0.0–5.0)
Cholesterol, Total: 102 mg/dL (ref 100–199)
HDL: 42 mg/dL (ref >39)
LDL Chol Calc (NIH): 48 mg/dL (ref 0–99)
Triglycerides: 51 mg/dL (ref 0–149)
VLDL Cholesterol Cal: 12 mg/dL (ref 5–40)

## 2020-02-01 ENCOUNTER — Ambulatory Visit: Payer: 59

## 2020-03-07 ENCOUNTER — Ambulatory Visit (INDEPENDENT_AMBULATORY_CARE_PROVIDER_SITE_OTHER): Payer: 59 | Admitting: Orthopaedic Surgery

## 2020-03-07 ENCOUNTER — Ambulatory Visit: Payer: Self-pay

## 2020-03-07 DIAGNOSIS — M7542 Impingement syndrome of left shoulder: Secondary | ICD-10-CM | POA: Diagnosis not present

## 2020-03-07 DIAGNOSIS — M25512 Pain in left shoulder: Secondary | ICD-10-CM | POA: Diagnosis not present

## 2020-03-07 MED ORDER — LIDOCAINE HCL 1 % IJ SOLN
3.0000 mL | INTRAMUSCULAR | Status: AC | PRN
  Administered 2020-03-07: 3 mL

## 2020-03-07 MED ORDER — METHYLPREDNISOLONE ACETATE 40 MG/ML IJ SUSP
40.0000 mg | INTRAMUSCULAR | Status: AC | PRN
  Administered 2020-03-07: 40 mg via INTRA_ARTICULAR

## 2020-03-07 NOTE — Progress Notes (Signed)
Office Visit Note   Patient: Bob Adams           Date of Birth: 20-May-1961           MRN: [id number] Visit Date: 03/07/2020              Requested by: Martha Clan, MD [address] [place],  [place] [address] PCP: Martha Clan, MD   Assessment & Plan: Visit Diagnoses:  1. Left shoulder pain, unspecified chronicity   2. Impingement syndrome of left shoulder     Plan: From an orthopedic standpoint, I do feel this is shoulder impingement syndrome and recommended a steroid injection in the subacromial outlet which he agreed to and tolerated well.  I described the risk and benefits of steroid injections.  He is someone that I would repeat the injection in anywhere from 9 to 12 weeks if he is still having symptoms.  He will let me know if that is the case.  All questions and concerns were answered and addressed.  Follow-up is otherwise as needed.  Follow-Up Instructions: Return if symptoms worsen or fail to improve.   Orders:  Orders Placed This Encounter  Procedures  . Large Joint Inj  . XR Shoulder Left   No orders of the defined types were placed in this encounter.     Procedures: Large Joint Inj: L subacromial bursa on 03/07/2020 10:28 AM Indications: pain and diagnostic evaluation Details: 22 G 1.5 in needle  Arthrogram: No  Medications: 3 mL lidocaine 1 %; 40 mg methylPREDNISolone acetate 40 MG/ML Outcome: tolerated well, no immediate complications Procedure, treatment alternatives, risks and benefits explained, specific risks discussed. Consent was given by the patient. Immediately prior to procedure a time out was called to verify the correct patient, procedure, equipment, support staff and site/side marked as required. Patient was prepped and draped in the usual sterile fashion.       Clinical Data: No additional findings.   Subjective: Chief Complaint  Patient presents with  . Left Shoulder - Pain  The patient is well-known to me.  He is a very  athletic and active 59 year old gentleman who comes in with left shoulder pain has been hurting for a few weeks now with no known injury.  Hurts with overhead activities and reaching behind him.  He does a lot of lower body exercises and he plays soccer but he does not do any upper extremity work.  He is never had shoulder surgery as well.  He points to the subacromial outlet area and the proximal deltoid is the source of his pain.  He denies any numbness and tingling in his hand and denies any neck pain.  He has had no other acute medical issues  HPI  Review of Systems .  There is currently listed no headache, chest pain, shortness of breath, fever, chills, nausea, vomiting  Objective: Vital Signs: There were no vitals taken for this visit.  Physical Exam He is alert and oriented x3 and in no acute distress Ortho Exam Examination of his left shoulder shows he has full range of motion.  There is pain subacromial outlet only. Specialty Comments:  No specialty comments available.  Imaging: XR Shoulder Left  Result Date: 03/07/2020 3 views left shoulder show no acute findings.  There is some AC joint arthritic changes.    PMFS History: Patient Active Problem List   Diagnosis Date Noted  . Microcytic anemia 12/18/2019  . Thrombocytopenia (HCC) 12/18/2019  . Angina pectoris (HCC) 12/17/2019  .  Right inguinal hernia 11/17/2017  . Trigger thumb, right thumb 08/18/2017  . Bilateral groin pain 01/11/2013  . Coronary artery disease   . Dyslipidemia   . Non Q wave myocardial infarction (HCC) 06/20/2008   Past Medical History:  Diagnosis Date  . Allergy   . Alpha thalassemia (HCC)   . Coronary artery disease    a.  s/p DES to prox LAD 2010. b. s/p CTO PCI of RCA and DES to LAD in 11/2019.  Marland Kitchen Dyslipidemia   . GERD (gastroesophageal reflux disease)   . Microcytosis    mild anemia/thrombocytopenia noted on labs with microcytosis  . Non Q wave myocardial infarction (HCC) 06/2008    stent  . Right inguinal hernia 11/17/2017    Family History  Problem Relation Age of Onset  . Heart attack Mother   . Colon cancer Neg Hx     Past Surgical History:  Procedure Laterality Date  . CARDIOVASCULAR STRESS TEST  10/24/2009   EF 60%  . CORONARY STENT INTERVENTION N/A 12/17/2019   Procedure: CORONARY STENT INTERVENTION;  Surgeon: Swaziland, Peter M, MD;  Location: [facility] INVASIVE CV LAB;  Service: Cardiovascular;  Laterality: N/A;  . CORONARY STENT PLACEMENT  06/28/2008   SUCCESSFUL INTRACORONARY STENTING OF THE PROXIMAL LEFT ANTERIOR DESCENDING WITH THROMBUS EXTRACTION. EF 65%  . fracture arm    . INGUINAL HERNIA REPAIR Right 11/17/2017   Procedure: OPEN RIGHT INGUINAL HERNIA REPAIR;  Surgeon: Claud Kelp, MD;  Location: [facility];  Service: General;  Laterality: Right;  . INSERTION OF MESH Right 11/17/2017   Procedure: INSERTION OF MESH;  Surgeon: Claud Kelp, MD;  Location: [facility];  Service: General;  Laterality: Right;  . INTRAVASCULAR ULTRASOUND/IVUS N/A 12/17/2019   Procedure: Intravascular Ultrasound/IVUS;  Surgeon: Swaziland, Peter M, MD;  Location: [facility] INVASIVE CV LAB;  Service: Cardiovascular;  Laterality: N/A;  . KNEE ARTHROSCOPY    . LEFT HEART CATH AND CORONARY ANGIOGRAPHY N/A 12/17/2019   Procedure: LEFT HEART CATH AND CORONARY ANGIOGRAPHY;  Surgeon: Swaziland, Peter M, MD;  Location: [facility] INVASIVE CV LAB;  Service: Cardiovascular;  Laterality: N/A;   Social History   Occupational History  . Occupation: Garment/textile technologist: [employer]    Comment: [employer]  Tobacco Use  . Smoking status: Never Smoker  . Smokeless tobacco: Never Used  Vaping Use  . Vaping Use: Never used  Substance and Sexual Activity  . Alcohol use: No  . Drug use: No  . Sexual activity: Not on file

## 2020-04-02 ENCOUNTER — Ambulatory Visit: Payer: 59 | Attending: Internal Medicine

## 2020-04-02 DIAGNOSIS — Z23 Encounter for immunization: Secondary | ICD-10-CM

## 2020-04-02 NOTE — Progress Notes (Signed)
   Covid-19 Vaccination Clinic  Name:  Bob Adams    MRN: [id number] DOB: [birth year]  04/02/2020  [name] was observed post Covid-19 immunization for 15 minutes without incident. He was provided with Vaccine Information Sheet and instruction to access the V-Safe system.   [name] was instructed to call 911 with any severe reactions post vaccine: Marland Kitchen Difficulty breathing  . Swelling of face and throat  . A fast heartbeat  . A bad rash all over body  . Dizziness and weakness   Immunizations Administered    Name Date Dose VIS Date Route   Pfizer COVID-19 Vaccine 04/02/2020  9:31 AM 0.3 mL 03/08/2020 Intramuscular   Manufacturer: [employer], [employer]   Lot: [id number]   NDC: [address]-3

## 2020-04-08 NOTE — Progress Notes (Signed)
[name] Date of Birth: 1960-06-12   History of Present Illness: Bob Adams is seen today for followup of abnormal ETT.  He is status post stenting of the proximal LAD in 2010 with a drug-eluting stent. He also had 70% RCA disease and 50% LCx disease at that time.  Stress Echo in June 2011 was normal. ETT in Dec. 2017 was normal.  He states he was doing well until about 3-4 weeks ago. In April he began a month long fast for Ramadan. He lost weight which is typical. He did not exercise during this time and really didn't resume exercise for 2 weeks after to regain some strength and weight.  He states that when he resumed exercise he noted a significant difference in his HR. Before he could get his HR up to 160-170. But now HR didn't go over 126. He developed a sensation in the left pectoral region that felt like "an injury that is healing". If he let his HR drop the symptoms resolved. After 10 minute warm up symptoms would improve and he could continue exercise. He feels that something clearly has changed from before. An ETT was ordered. While exercise tolerance was still good it was not as good as before and he did develop new ST depression.     He subsequently underwent cardiac cath showing severe 2 vessel CAD with  Progressive LAD disease just proximal to the old stent and there was a calcified lesion distal to the stent in the mid LAD. This was successfully treated with DES following Shock wave therapy. The RCA had a new CTO in the mid vessel. This was successfully reopened and stented with DES. The PL branch remained occluded with good collaterals.   On follow up today he is feeling well. Denies any chest pain or SOB. He is back in the gym working out vigorously. Following diet.     Current Outpatient Medications on File Prior to Visit  Medication Sig Dispense Refill  . aspirin 81 MG tablet Take 81 mg by mouth daily.      . nitroGLYCERIN (NITROSTAT) 0.4 MG SL tablet Place 1 tablet (0.4  mg total) under the tongue every 5 (five) minutes as needed for chest pain. 25 tablet 3  . omeprazole (PRILOSEC) 20 MG capsule Take 20 mg by mouth daily as needed (acid reflux).    . ticagrelor (BRILINTA) 90 MG TABS tablet Take 1 tablet (90 mg total) by mouth 2 (two) times daily. 60 tablet 11   No current facility-administered medications on file prior to visit.    No Known Allergies  Past Medical History:  Diagnosis Date  . Allergy   . Alpha thalassemia (HCC)   . Coronary artery disease    a.  s/p DES to prox LAD 2010. b. s/p CTO PCI of RCA and DES to LAD in 11/2019.  Marland Kitchen Dyslipidemia   . GERD (gastroesophageal reflux disease)   . Microcytosis    mild anemia/thrombocytopenia noted on labs with microcytosis  . Non Q wave myocardial infarction (HCC) 06/2008   stent  . Right inguinal hernia 11/17/2017    Past Surgical History:  Procedure Laterality Date  . CARDIOVASCULAR STRESS TEST  10/24/2009   EF 60%  . CORONARY STENT INTERVENTION N/A 12/17/2019   Procedure: CORONARY STENT INTERVENTION;  Surgeon: [place], Tondra Reierson M, MD;  Location: [facility] INVASIVE CV LAB;  Service: Cardiovascular;  Laterality: N/A;  . CORONARY STENT PLACEMENT  06/28/2008   SUCCESSFUL INTRACORONARY STENTING OF THE PROXIMAL LEFT ANTERIOR  DESCENDING WITH THROMBUS EXTRACTION. EF 65%  . fracture arm    . INGUINAL HERNIA REPAIR Right 11/17/2017   Procedure: OPEN RIGHT INGUINAL HERNIA REPAIR;  Surgeon: Claud Kelp, MD;  Location: [facility];  Service: General;  Laterality: Right;  . INSERTION OF MESH Right 11/17/2017   Procedure: INSERTION OF MESH;  Surgeon: Claud Kelp, MD;  Location: [facility];  Service: General;  Laterality: Right;  . INTRAVASCULAR ULTRASOUND/IVUS N/A 12/17/2019   Procedure: Intravascular Ultrasound/IVUS;  Surgeon: [place], Evalena Fujii M, MD;  Location: [facility] INVASIVE CV LAB;  Service: Cardiovascular;  Laterality: N/A;  . KNEE ARTHROSCOPY    . 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  Family History  Problem Relation Age of Onset  . Heart attack Mother   . Colon cancer Neg Hx     Review of Systems:  All other systems were reviewed and are negative.  Physical Exam: BP 123/71   Pulse 84   Temp 97.9 F (36.6 C)   Ht 5\' 6"  (1.676 m)   Wt 159 lb 9.6 oz (72.4 kg)   SpO2 97%   BMI 25.76 kg/m  GENERAL:  Well appearing male in NAD HEENT:  PERRL, EOMI, sclera are clear. Oropharynx is clear. NECK:  No jugular venous distention, carotid upstroke brisk and symmetric, no bruits, no thyromegaly or adenopathy LUNGS:  Clear to auscultation bilaterally CHEST:  Unremarkable HEART:  RRR,  PMI not displaced or sustained,S1 and S2 within normal limits, no S3, no S4: no clicks, no rubs, no murmurs ABD:  Soft, nontender. BS +, no masses or bruits. No hepatomegaly, no splenomegaly EXT:  2 + pulses throughout, no edema, no cyanosis no clubbing SKIN:  Warm and dry.  No rashes NEURO:  Alert and oriented x 3. Cranial nerves II through XII intact. PSYCH:  Cognitively intact   LABORATORY DATA:  Lab Results  Component Value Date   WBC 9.2 12/18/2019   HGB 11.2 (L) 12/18/2019   HCT 37.2 (L) 12/18/2019   PLT 130 (L) 12/18/2019   GLUCOSE 111 (H) 12/18/2019   CHOL 102 01/17/2020   TRIG 51 01/17/2020   HDL 42 01/17/2020   LDLCALC 48 01/17/2020   ALT 23 06/18/2018   AST 24 06/18/2018   NA 139 12/18/2019   K 3.7 12/18/2019   CL 105 12/18/2019   CREATININE 0.71 12/18/2019   BUN 8 12/18/2019   CO2 27 12/18/2019   INR 1.0 06/27/2008     Dated 06/09/15: cholesterol 117, triglycerides 77, LDL 71,  HDL 31. A1c 4.5%. CMET, CBC, TSH normal Dated 06/14/16: cholesterol 117, triglycerides 65, HDL 41, LDL 63, A1c 4.3%. Hgb 12.3. Chemistries normal. Dated 06/20/17: cholesterol 101, triglycerides 64, HDL 37, LDL 51, plts 131K. T. Bili 2.6. Other chemistries and CBC normal. TSH normal. Dated 06/26/18: cholesterol 124, triglycerides 73, HDL 36, LDL 73. A1c 4.5%. plts 128K. Other CMET, CBC, TSH normal.  Dated 07/14/19: cholesterol 124, triglycerides 55, HDL 44, LDL 69. A1c 4.7%. Normal CMET and CBC.      ETT 05/15/16:Study Highlights    There was  no ST segment deviation noted during stress.   ETT with excellent exercise tolerance (14:01); no chest pain; normal BP response; no ST changes; negative adequate ETT.   Stress Findings   ECG Baseline ECG exhibits normal sinus rhythm..    Stress Findings The patient exercised following the Bruce protocol.  The patient reported no symptoms during the stress test. The patient experienced no angina during the stress test.   The test was stopped because the patient complained of fatigue.   Blood pressure and heart rate demonstrated a normal response to exercise. Overall, the patient's exercise capacity was excellent.   85% of maximum heart rate was achieved after 8 minutes. Recovery time: 15 minutes.  Duke Treadmill Score: intermediate risk The patient's response to exercise was adequate for diagnosis.    Response to Stress There was no ST segment deviation noted during stress.  Arrhythmias during stress: none.  Arrhythmias during recovery: none.  There were no significant arrhythmias noted during the test.  ECG was interpretable and there was no significant change from baseline.    Stress Measurements   Baseline Vitals  Rest HR 85 bpm    Rest BP 117/74 mmHg    Exercise Time  Exercise duration (min) 14 min    Exercise duration (sec) 1 sec    Peak Stress Vitals  Peak HR 190 bpm    Peak BP 129/67 mmHg    Exercise Data  MPHR 165 bpm     Percent HR 115 %    RPE 16     Estimated workload 17.2 METS       ETT 11/02/19: Study Highlights   Blood pressure demonstrated a normal response to exercise.  There was 2mm of horizontal to upsloping ST segment depression in the inferolateral leads at peak exercise which became downsloping in recovery.  Positive exercise stress test for ischemia.  The patient exercised for 10 minutes reahcing 107% MPHR.  Excellent exercise capacity achieving 13.4 mets.  Consider further evaluation with coronary CTA or Nuclear stress testing.  Cardiac cath 12/17/19:  CORONARY STENT INTERVENTION  Intravascular Ultrasound/IVUS  LEFT HEART CATH AND CORONARY ANGIOGRAPHY  Conclusion    Prox LAD to Mid LAD lesion is 25% stenosed.  Prox LAD lesion is 90% stenosed.  Mid LAD lesion is 50% stenosed.  A drug-eluting stent was successfully placed using a SYNERGY XD 3.0X32.  Post intervention, there is a 0% residual stenosis.  1st Mrg lesion is 40% stenosed.  Prox RCA lesion is 50% stenosed.  Prox RCA to Mid RCA lesion is 100% stenosed.  A drug-eluting stent was successfully placed using a SYNERGY XD 2.50X48.  Post intervention, there is a 0% residual stenosis.  Mid RCA to Dist RCA lesion is 100% stenosed.  Post intervention, there is a 0% residual stenosis.  Post intervention, there is a 0% residual stenosis.  Post intervention, there is a 0% residual stenosis.  The left ventricular systolic function is normal.  LV end diastolic pressure is normal.  The left ventricular ejection fraction is 55-65% by visual estimate.   1. Severe 2 vessel obstructive CAD    -90% proximal LAD at prior stent margin. 50% stenosis distal to stent with severe stenosis by IVUS with 360 degree ring of calcification.    - 100% CTO of the mid RCA.  2. Normal LV function.  3. Normal LVEDP 4. Successful CTO PCI of the mid RCA. This restored flow into a large RV marginal branch. DES x 1 with 2.5 x 48 mm  Synergy stent post dilated to 2.75 mm. The terminal RCA remained occluded with collateral flow but this only involves a rather small PL branch. The inferior wall is supplied by a wrap around LAD 5. Successful PCI of the proximal to mid LAD with Shock wave therapy for the severely calcified mid lesion. DES x 1 with 3.0 x 32 mm Onyx post dilated to 3.5 mm  Plan: DAPT for at least one year. I would favor long term DAPT since he has multiple stent layers in the LAD and a very long stent in the RCA. Will monitor overnight. Anticipate DC in am.    Assessment / Plan: 1. Coronary disease status post stenting of the proximal LAD in February 2010 with a 3.0 x 15 mm Xience stent. He had residual moderate disease in the LCx and RCA. ETT in Dec. 2017 was normal. He presented with  exertional symptoms c/w angina. ETT showed good exercise tolerance but has decreased from before and he has ST depression that is new. Cardiac cath showed severe stenosis in the proximal and mid LAD and CTO of the mid RCA. He underwent successful DES of the LAD. The CTO of the RCA was opened and stented establishing flow in a large RV branch. A relatively small PL branch was occluded with collaterals. The LAD wraps around the apex and supplies the inferior wall so there was no significant PDA coming off the RCA. He will remain on DAPT indefinitely.  Will follow up in 6 months.   2. Hyperlipidemia on Crestor- He is on maximal dose Crestor. Given his premature CAD and very high CV risk I think we need to target a more aggressive lipid goal of LDL less than 50-55. His last LDL was 48. He is planning to have this repeated in February with Dr Clelia Croft.

## 2020-04-10 ENCOUNTER — Ambulatory Visit (INDEPENDENT_AMBULATORY_CARE_PROVIDER_SITE_OTHER): Payer: 59 | Admitting: Cardiology

## 2020-04-10 ENCOUNTER — Encounter: Payer: Self-pay | Admitting: Cardiology

## 2020-04-10 VITALS — BP 123/71 | HR 84 | Temp 97.9°F | Ht 66.0 in | Wt 159.6 lb

## 2020-04-10 DIAGNOSIS — E785 Hyperlipidemia, unspecified: Secondary | ICD-10-CM

## 2020-04-10 DIAGNOSIS — I25118 Atherosclerotic heart disease of native coronary artery with other forms of angina pectoris: Secondary | ICD-10-CM

## 2020-04-10 MED ORDER — ROSUVASTATIN CALCIUM 40 MG PO TABS: 40.0000 mg | ORAL_TABLET | Freq: Every day | ORAL | 3 refills | Status: DC

## 2020-04-17 ENCOUNTER — Ambulatory Visit (INDEPENDENT_AMBULATORY_CARE_PROVIDER_SITE_OTHER): Payer: 59 | Admitting: Orthopaedic Surgery

## 2020-04-17 DIAGNOSIS — M7542 Impingement syndrome of left shoulder: Secondary | ICD-10-CM

## 2020-04-17 MED ORDER — METHYLPREDNISOLONE 4 MG PO TABS: ORAL_TABLET | ORAL | 0 refills | Status: DC

## 2020-04-17 NOTE — Progress Notes (Signed)
The patient is a 59 year old gentleman well-known to me.  He has a history of left shoulder pain and impingement syndrome.  I last saw him 6 weeks ago and provided a steroid injection in the subacromial outlet.  He has been doing a lot of leaf raking and blowing and has been still having some shoulder pain.  It has improved and is requesting injection today.  However, he understands that he really needs to wait between 9 to 12 weeks to have the shoulder injected.  He is not a diabetic.  Examination of his left shoulder today does show some positive signs of impingement with the rotator cuff is intact and his motion is entirely full of the left shoulder.  I would feel more comfortable with having him try a 6-day steroid taper and resting his shoulder.  We can see him back in 3 weeks to safely perform an injection in his left shoulder if needed at that standpoint.  He agrees with this treatment plan.  All question concerns were answered and addressed.

## 2020-04-18 ENCOUNTER — Ambulatory Visit: Payer: 59 | Admitting: Orthopaedic Surgery

## 2020-06-18 IMAGING — US US ABDOMEN LIMITED
1 series · 14 of 25 positions shown · non-contrast
Comparison: None.

CLINICAL DATA: 57 y/o  M; abdominal pain.

EXAM:
ULTRASOUND ABDOMEN LIMITED RIGHT UPPER QUADRANT

[Series 1: us abdomen limited · 14 of 45 slices shown]
[im 1/45]
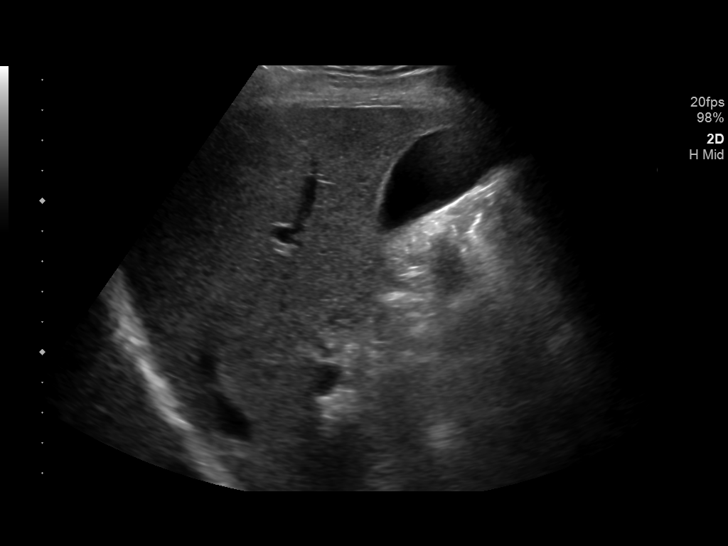
[im 4/45]
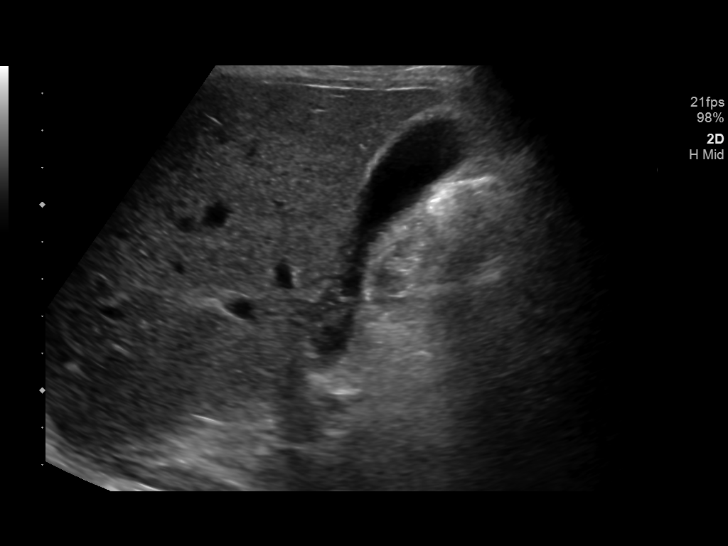
[im 8/45]
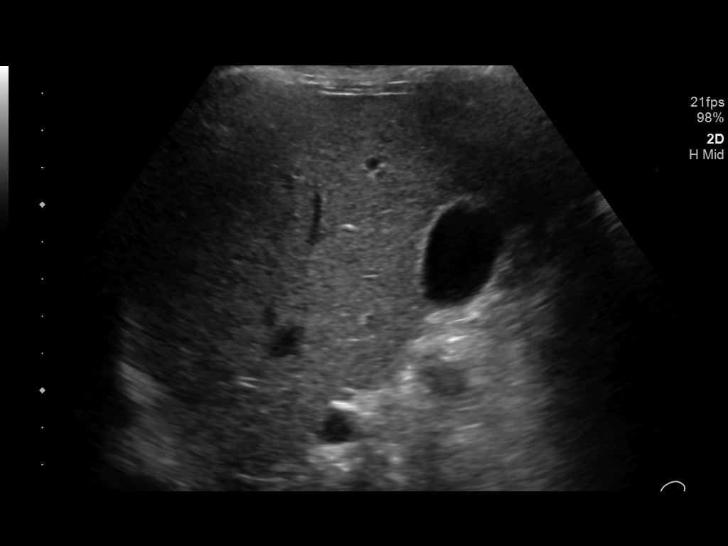
[im 12/45]
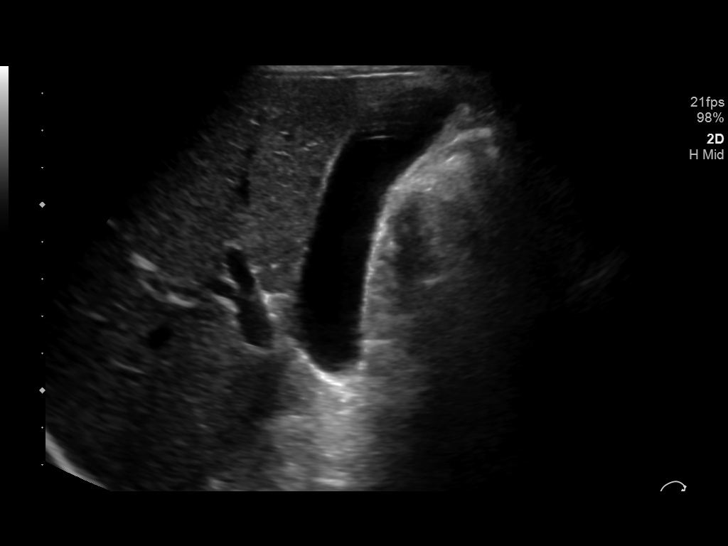
[im 15/45]
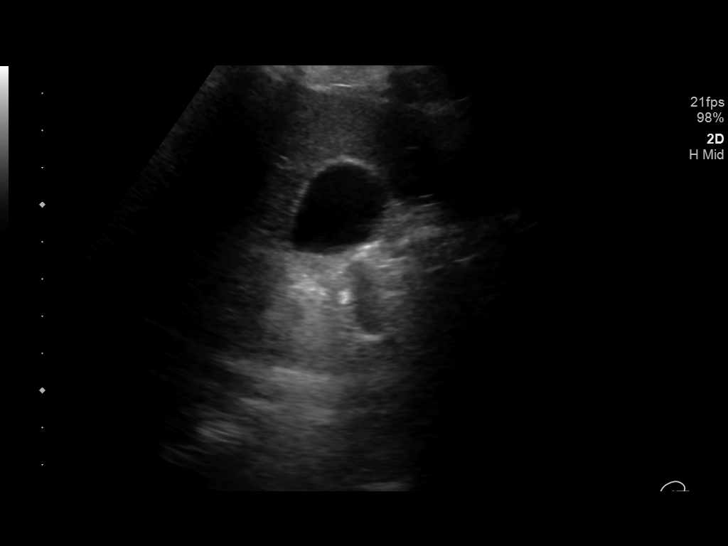
[im 17/45]
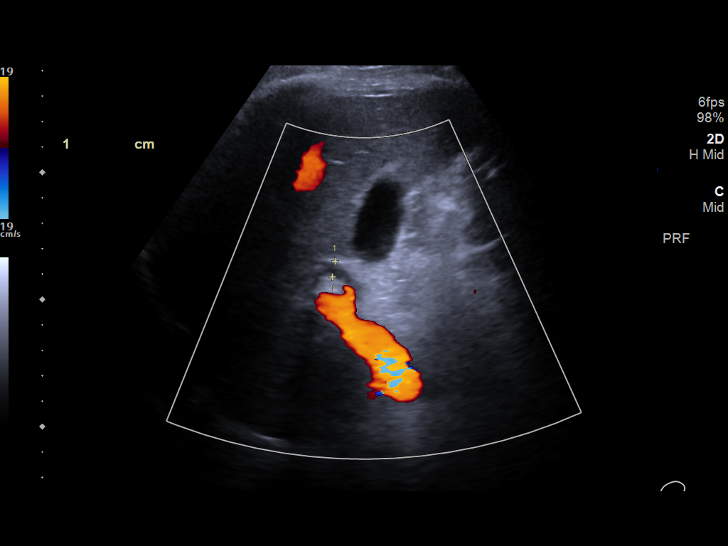
[im 21/45]
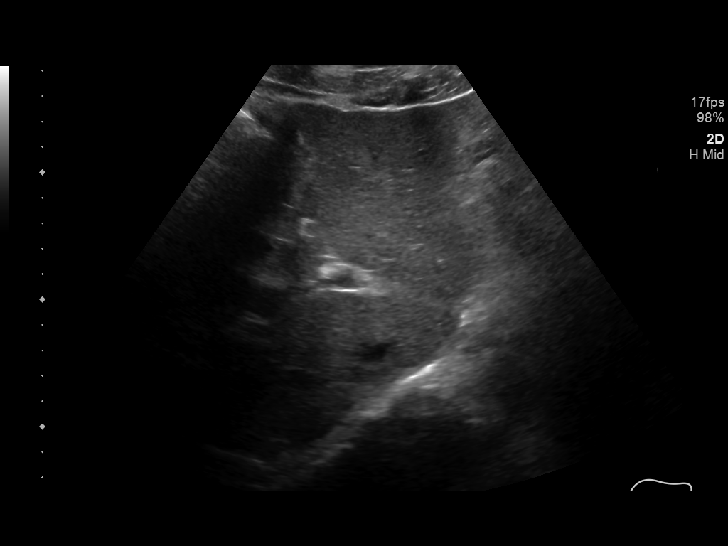
[im 24/45]
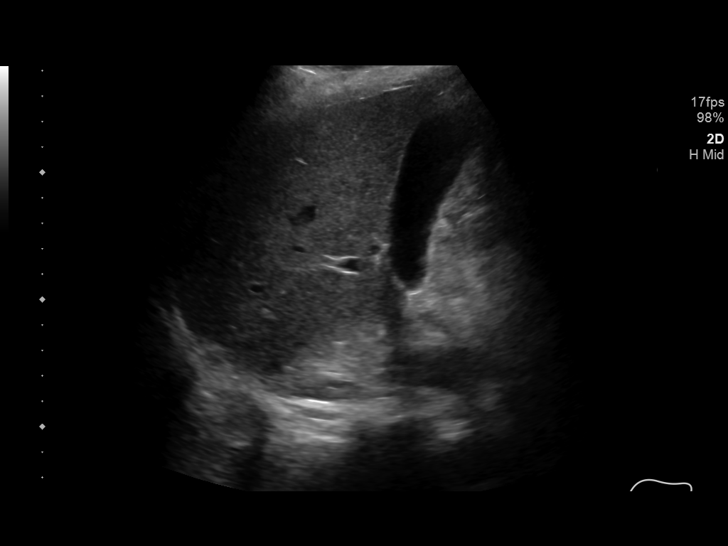
[im 28/45]
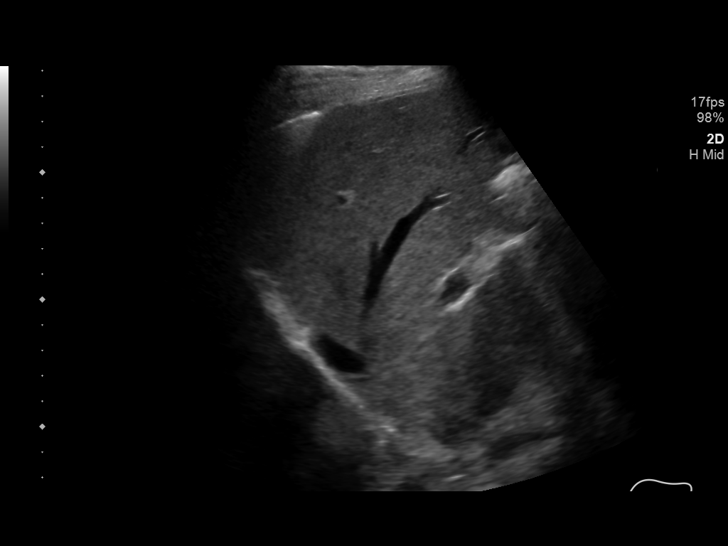
[im 30/45]
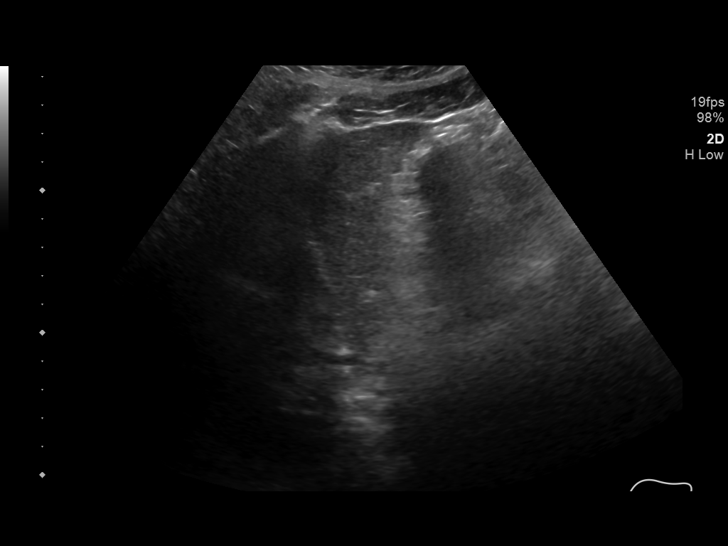
[im 34/45]
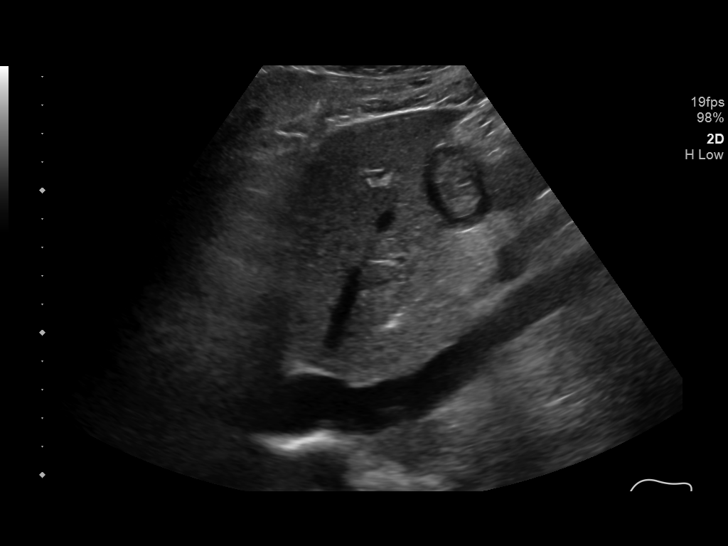
[im 37/45]
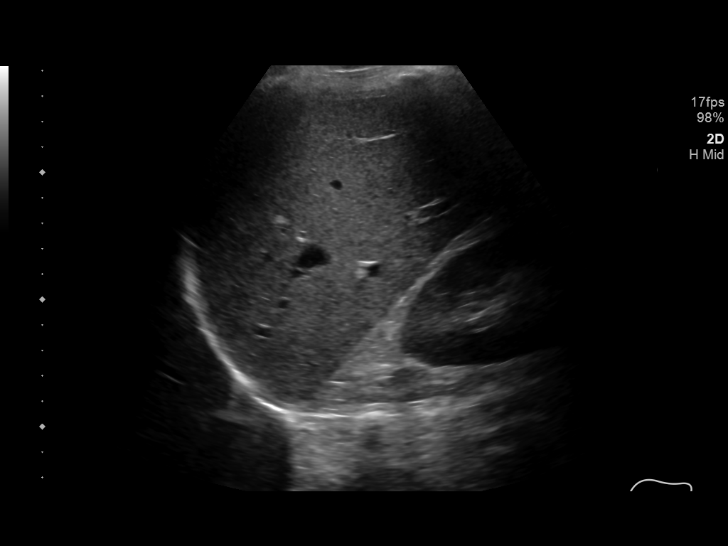
[im 41/45]
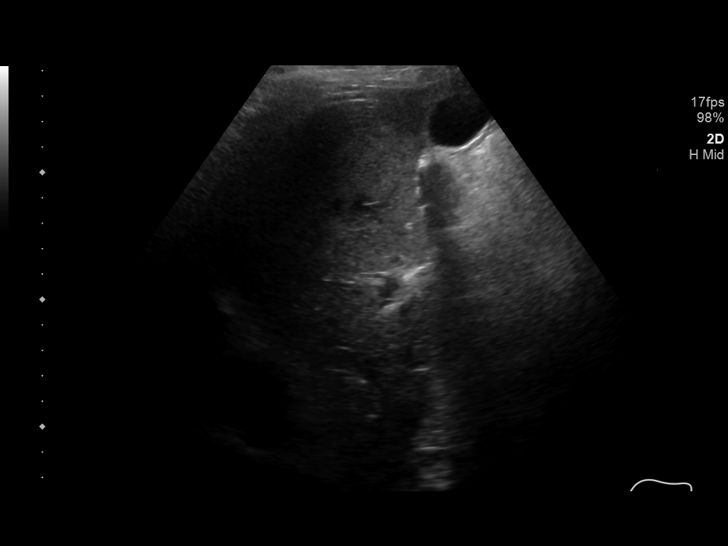
[im 45/45]
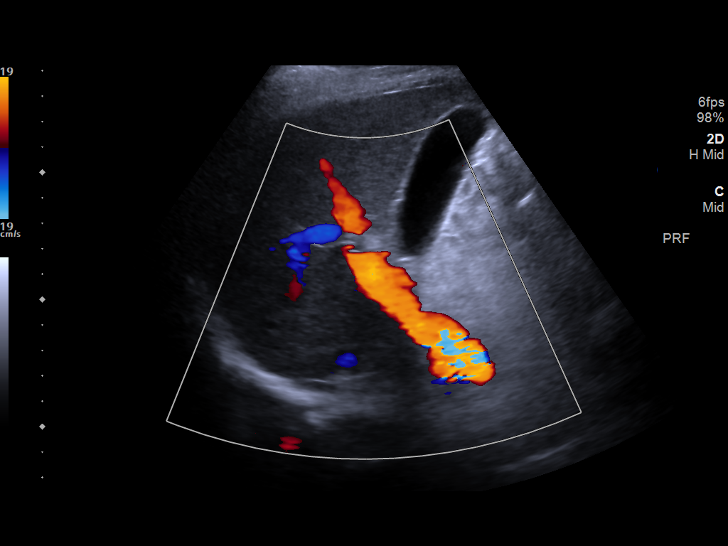

[14 of 25 positions shown; findings below may reference images not displayed]

FINDINGS: Gallbladder:

No gallstones or wall thickening visualized. No sonographic Murphy
sign noted by sonographer.

Common bile duct:

Diameter: 6 mm

Liver:

No focal lesion identified. Within normal limits in parenchymal
echogenicity. Portal vein is patent on color Doppler imaging with
normal direction of blood flow towards the liver.
IMPRESSION: Normal right upper quadrant ultrasound.

## 2020-06-18 IMAGING — CT CT ANGIO CHEST-ABD-PELV FOR DISSECTION W/ AND WO/W CM
2 of 7 series · 13 of 46 positions shown, 15 images · IV contrast (iopamidol)
Comparison: Chest x-ray from earlier today

CLINICAL DATA: Awoke with chest pressure.

EXAM:
CT ANGIOGRAPHY CHEST, ABDOMEN AND PELVIS
TECHNIQUE: Multidetector CT imaging through the chest, abdomen and pelvis was
performed using the standard protocol during bolus administration of
intravenous contrast. Multiplanar reconstructed images and MIPs were
obtained and reviewed to evaluate the vascular anatomy.
CONTRAST:  100mL [id number] IOPAMIDOL ([id number]) INJECTION 76%

[Series 6: axial arterial · axial · arterial · 0.71mm/px · z∈[+1271,+1793]mm · 10 of 200 slices shown, 12 images]
[im 13/200  soft-tissue]
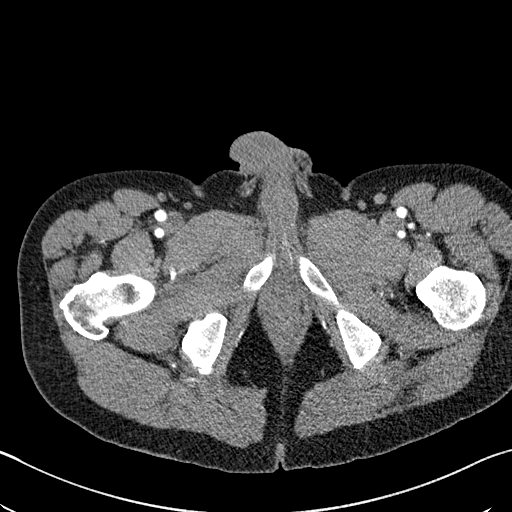
[im 13/200  bone]
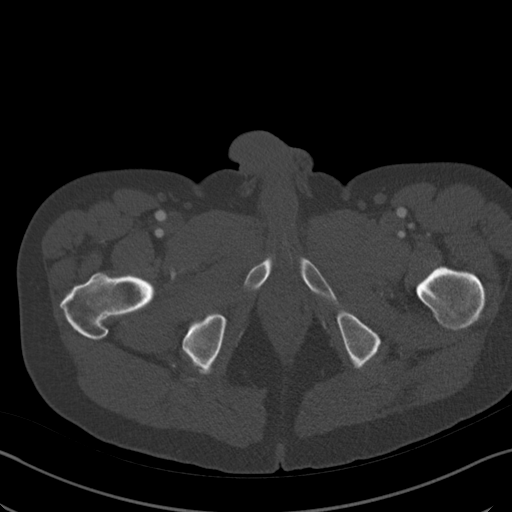
[im 38/200  soft-tissue]
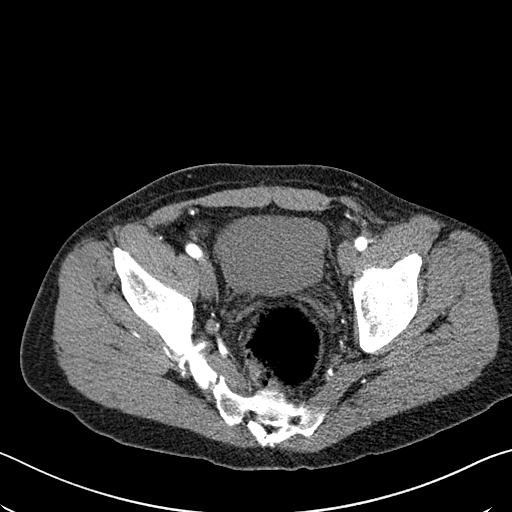
[im 50/200  soft-tissue]
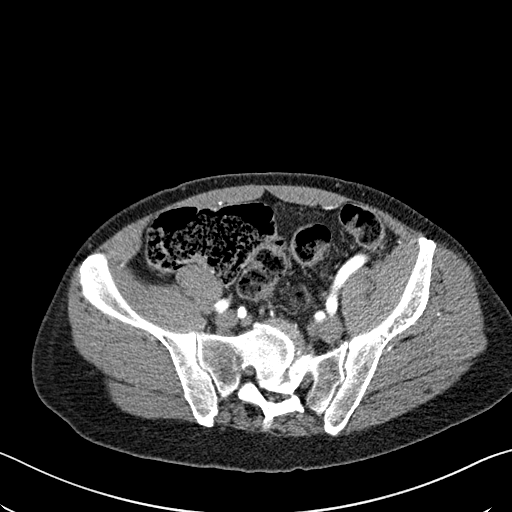
[im 75/200  soft-tissue]
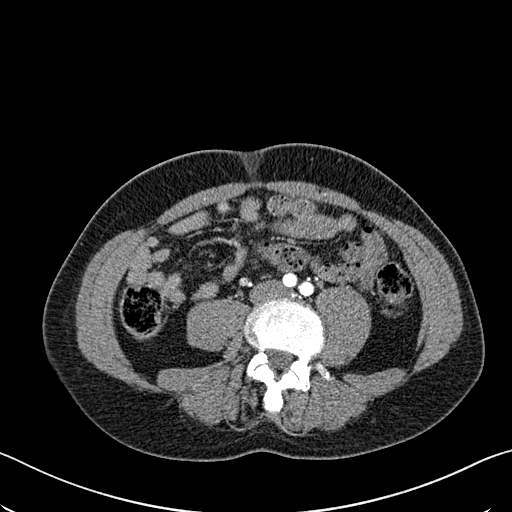
[im 88/200  soft-tissue]
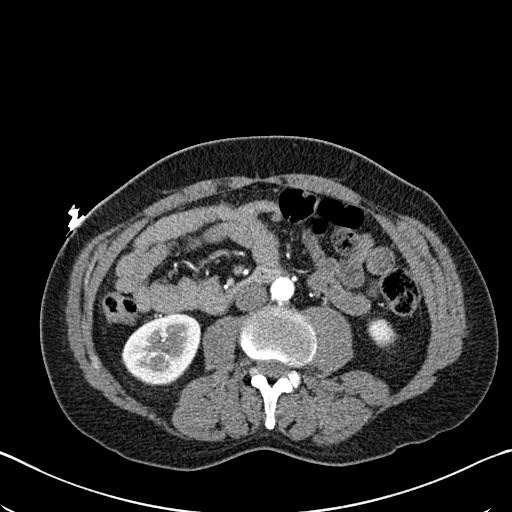
[im 112/200  soft-tissue]
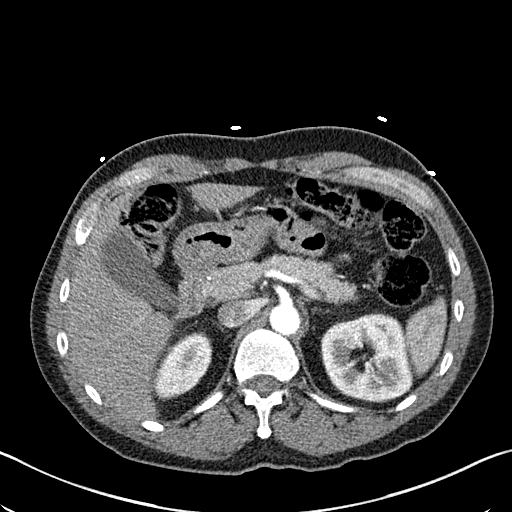
[im 125/200  soft-tissue]
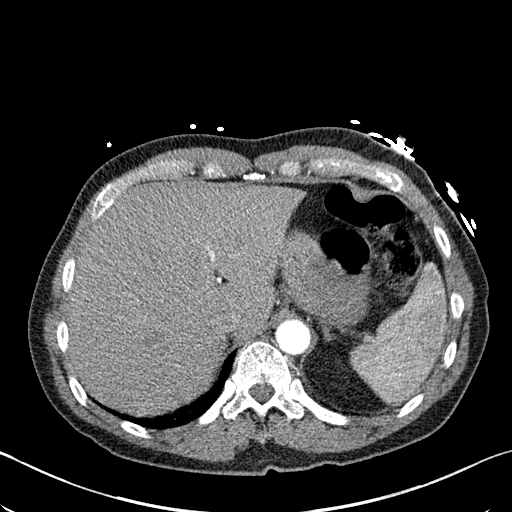
[im 150/200  soft-tissue]
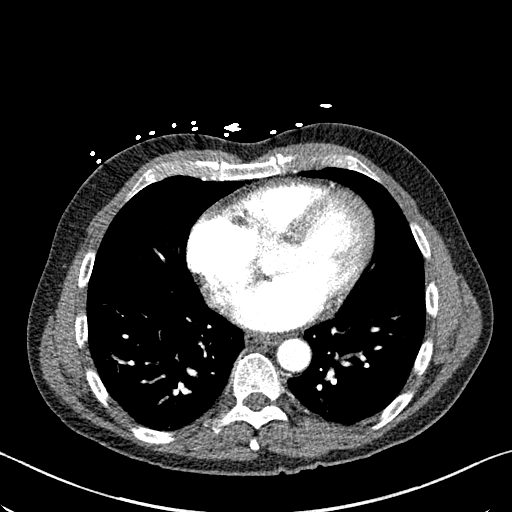
[im 162/200  soft-tissue]
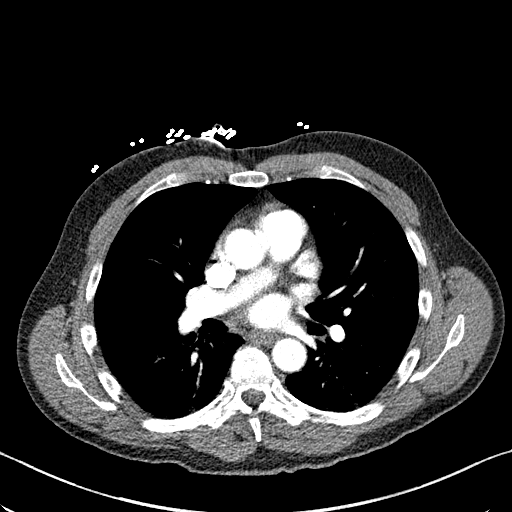
[im 162/200  bone]
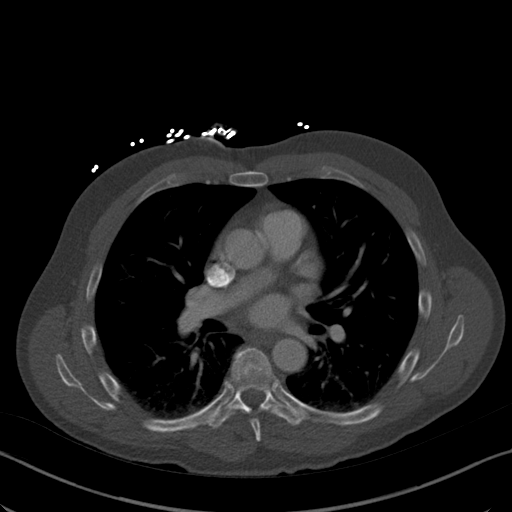
[im 187/200  soft-tissue]
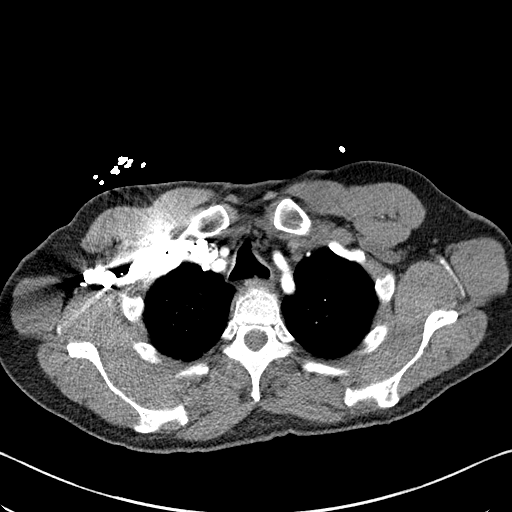

[Series 8: coronals · coronal · 0.62mm/px · 3 of 118 slices shown]
[im 30/118  soft-tissue]
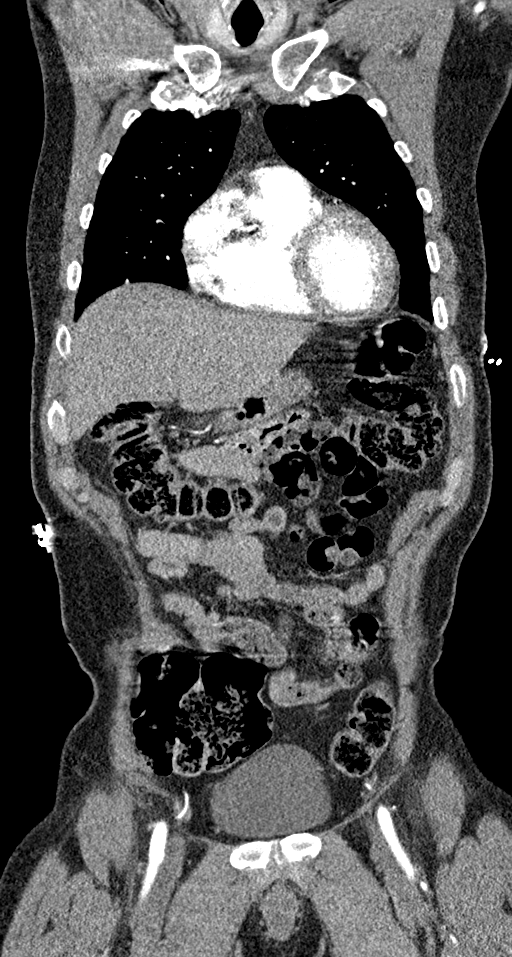
[im 59/118  soft-tissue]
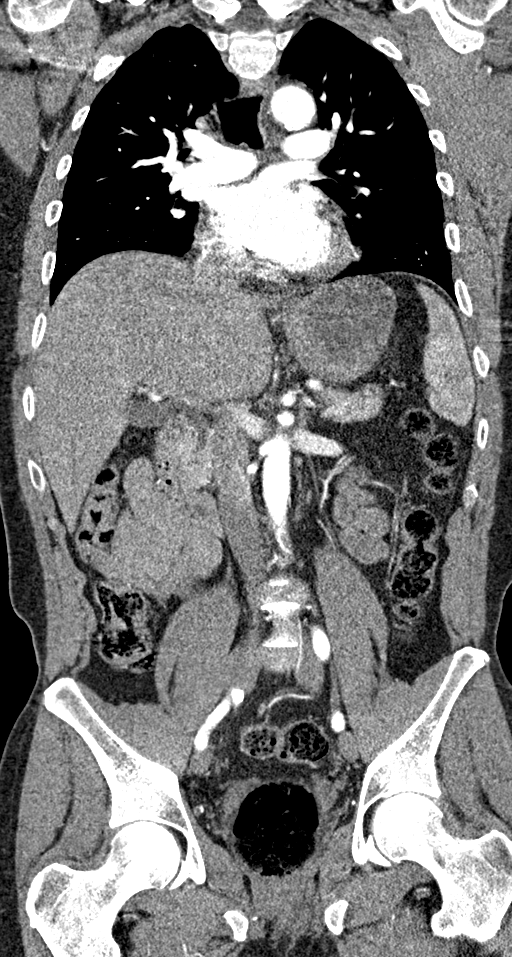
[im 88/118  soft-tissue]
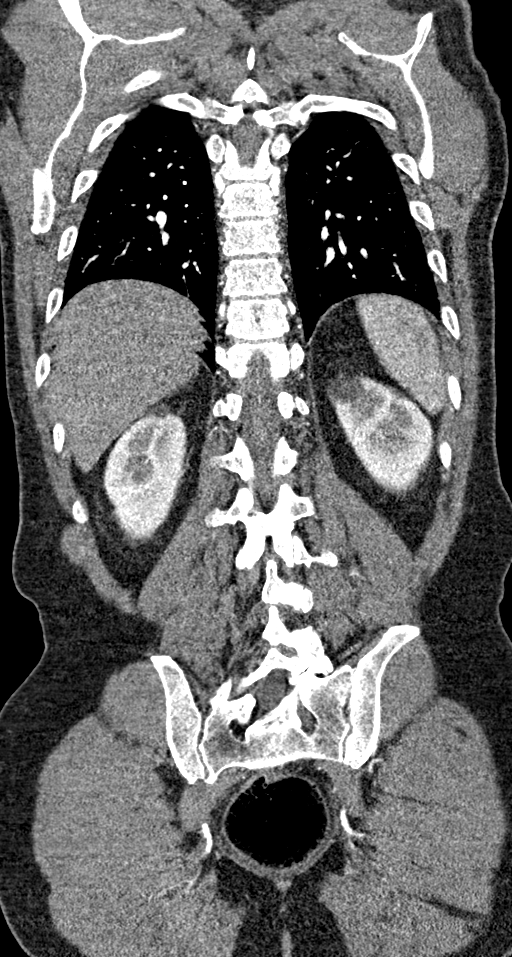

[13 of 46 positions shown; findings below may reference images not displayed]

FINDINGS: CTA CHEST FINDINGS

Cardiovascular: Noncontrast phase shows no aortic intramural
hematoma. There is extensive coronary atherosclerotic calcification
with probable LAD stent. Postcontrast imaging shows no aortic
dissection. No aortic aneurysm. The major pulmonary arteries are
opacified. Normal heart size. No pericardial effusion.

Mediastinum/Nodes: No acute finding or adenopathy.

Lungs/Pleura: There is no edema, consolidation, effusion, or
pneumothorax. Small air defect along the posterior wall trachea
usually related to adherent mucus.

Musculoskeletal: No acute finding.

Review of the MIP images confirms the above findings.

CTA ABDOMEN AND PELVIS FINDINGS

VASCULAR

Aorta: Mild atherosclerotic plaque.  No aneurysm or dissection.

Celiac: Calcified plaque at the origin with moderate ostial
narrowing that may be accentuated by median arcuate ligament
impression. No branch occlusion.

SMA: Widely patent.  No beading or branch occlusion.

Renals: Single bilateral renal arteries. Mild atherosclerotic plaque
at the origins without stenosis. No beading.

IMA: Patent

Inflow: Mild atherosclerotic calcification. No stenosis or
dissection.

Veins: Unremarkable in the arterial phase

Review of the MIP images confirms the above findings.

NON-VASCULAR

Hepatobiliary: No focal liver abnormality.No evidence of biliary
obstruction or stone.

Pancreas: Unremarkable.

Spleen: Unremarkable.

Adrenals/Urinary Tract: Negative adrenals. No hydronephrosis or
stone. Small renal cystic densities. Unremarkable bladder.

Stomach/Bowel:  No obstruction. Negative for bowel obstruction.

Lymphatic: No mass or adenopathy.

Reproductive:No pathologic findings.

Other: No ascites or pneumoperitoneum. Right inguinal hernia repair.

Musculoskeletal: No acute abnormalities. S1 hemivertebra with
levoscoliosis and focal advanced left L5-S1 facet degeneration
causing foraminal narrowing.

Review of the MIP images confirms the above findings.
IMPRESSION: 1. Negative for aortic dissection or other acute finding.
2. Multifocal coronary atherosclerosis.
3. Mild aortic atherosclerosis.  Moderate celiac origin stenosis.

## 2020-07-27 NOTE — Telephone Encounter (Signed)
Yes I agree. Can set up to see our pharmacy or with Dr Clelia Croft. Would need to get a copy of labs  [name] [place] MD, [facility]

## 2020-08-01 ENCOUNTER — Ambulatory Visit (INDEPENDENT_AMBULATORY_CARE_PROVIDER_SITE_OTHER): Payer: 59 | Admitting: Pharmacist

## 2020-08-01 ENCOUNTER — Other Ambulatory Visit: Payer: Self-pay

## 2020-08-01 VITALS — BP 128/66 | HR 79 | Resp 14 | Ht 66.0 in | Wt 161.6 lb

## 2020-08-01 DIAGNOSIS — I214 Non-ST elevation (NSTEMI) myocardial infarction: Secondary | ICD-10-CM

## 2020-08-01 DIAGNOSIS — I2510 Atherosclerotic heart disease of native coronary artery without angina pectoris: Secondary | ICD-10-CM | POA: Diagnosis not present

## 2020-08-01 DIAGNOSIS — E785 Hyperlipidemia, unspecified: Secondary | ICD-10-CM | POA: Diagnosis not present

## 2020-08-01 DIAGNOSIS — Z0000 Encounter for general adult medical examination without abnormal findings: Secondary | ICD-10-CM | POA: Insufficient documentation

## 2020-08-01 NOTE — Patient Instructions (Addendum)
It was nice meeting you today!  We would like your LDL (bad cholesterol) to be less than 50  Continue your rosuvastatin 40mg  once daily  We will start you on a medication called Praluent which you will inject once every two weeks  We will recheck your cholesterol panel in 2-3 months  Continue your diet and exercise routine  Please call with any questions!   , PharmD, BCACP, CDCES, CPP [facility] [address], [place], [place] Phone: [phone]; Fax: [phone] 08/01/2020 3:44 PM

## 2020-08-01 NOTE — Progress Notes (Signed)
Patient ID: Bob Adams                 DOB: July 05, 1960                    MRN: [id number]     HPI: Bob Adams is a 60 y.o. male patient referred to lipid clinic by Dr Swaziland. PMH is significant for MI, HLD,  angina, and atheroclerosis.  In July 2021 patient was admitted to and had a successful PCI of proximal mid LAD with a DES.  Patient seen by primary care provider a received an updated lipid panel.  LDL has increased to 59 and patient has an aggressive LDL goal of <50.  Patient follows a strict diet and is an aggressive exerciser.    Current Medications:  Crestor 40mg  Intolerances: N/A Risk Factors: Hx of MI, CABG, angina LDL goal: <50-55 per Dr  Family History: Both parents have history of elevated cholesterol  Labs: LDL 59, TC 118, HDL 46, Trigs 66 (07/19/20 on rosuvastatin 40mg )  Past Medical History:  Diagnosis Date  . Allergy   . Alpha thalassemia (HCC)   . Coronary artery disease    a.  s/p DES to prox LAD 2010. b. s/p CTO PCI of RCA and DES to LAD in 11/2019.  2011 Dyslipidemia   . GERD (gastroesophageal reflux disease)   . Microcytosis    mild anemia/thrombocytopenia noted on labs with microcytosis  . Non Q wave myocardial infarction (HCC) 06/2008   stent  . Right inguinal hernia 11/17/2017    Current Outpatient Medications on File Prior to Visit  Medication Sig Dispense Refill  . aspirin 81 MG tablet Take 81 mg by mouth daily.      . methylPREDNISolone (MEDROL) 4 MG tablet Medrol dose pack. Take as instructed 21 tablet 0  . nitroGLYCERIN (NITROSTAT) 0.4 MG SL tablet Place 1 tablet (0.4 mg total) under the tongue every 5 (five) minutes as needed for chest pain. 25 tablet 3  . omeprazole (PRILOSEC) 20 MG capsule Take 20 mg by mouth daily as needed (acid reflux).    . rosuvastatin (CRESTOR) 40 MG tablet Take 1 tablet (40 mg total) by mouth daily. 90 tablet 3  . ticagrelor (BRILINTA) 90 MG TABS tablet Take 1 tablet (90 mg total) by mouth 2 (two) times  daily. 60 tablet 11   No current facility-administered medications on file prior to visit.    No Known Allergies  Assessment/Plan:  1. Hyperlipidemia - Patient's most recent LDL was 59 which is above goal of <50-55 despite maximally tolerated high intensity statin therapy and vigorous exercise.  Dr 07/2008 and Dr 01/18/2018 recommended patient consider PCSK9 therapy.  Checked patient's formulary and it appears Praluent is approved.  Using [place], taught patient storage, site selection and administration.  Patient was able to demonstrate in room using demo pen.  Discussed mechanism of action and possible adverse effects.  Will complete PA and contact patient. Printed out copay card and set up follow up lipid panel for the end of May.  Patient voiced understanding.  Continue rosuvastatin 40mg  daily Begin Praluent 75mg  once every 14 days Continue diet and exercise regimen Recheck lipid panel in 2-3 months  [employer], PharmD, BCACP, CDCES, CPP [facility] [address], [place], [place] [facility] Phone: [phone]; Fax: [phone] 08/01/2020 4:41 PM

## 2020-08-02 ENCOUNTER — Telehealth: Payer: Self-pay | Admitting: Pharmacist

## 2020-08-02 DIAGNOSIS — E785 Hyperlipidemia, unspecified: Secondary | ICD-10-CM

## 2020-08-02 DIAGNOSIS — I2510 Atherosclerotic heart disease of native coronary artery without angina pectoris: Secondary | ICD-10-CM

## 2020-08-02 MED ORDER — PRALUENT 75 MG/ML ~~LOC~~ SOAJ: 1.0000 mL | SUBCUTANEOUS | 3 refills | Status: DC

## 2020-08-02 NOTE — Telephone Encounter (Signed)
PA for Praluent approved through 08/01/21.  Called patient and he is aware.  Sending to [employer]

## 2020-10-06 NOTE — Progress Notes (Signed)
Bob Adams Date of Birth: 05-15-1961   History of Present Illness: Bob Adams is seen today for followup CAD. He is status post stenting of the proximal LAD in 2010 with a drug-eluting stent- 3.0 x 15 mm Xience. He also had 70% RCA disease and 50% LCx disease at that time.  Stress Echo in June 2011 was normal. ETT in Dec. 2017 was normal.   In April 2021 he began a month long fast for Ramadan. He lost weight which is typical. He did not exercise during this time and really didn't resume exercise for 2 weeks after to regain some strength and weight.  He states that when he resumed exercise he noted a significant difference in his HR. Before he could get his HR up to 160-170. But now HR didn't go over 126. He developed a sensation in the left pectoral region that felt like "an injury that is healing". If he let his HR drop the symptoms resolved. After 10 minute warm up symptoms would improve and he could continue exercise. He felt that something clearly has changed from before. An ETT was ordered. While exercise tolerance was still good it was not as good as before and he did develop new ST depression.     He subsequently underwent cardiac cath showing severe 2 vessel CAD with  Progressive LAD disease just proximal to the old stent and there was a calcified lesion distal to the stent in the mid LAD. This was successfully treated with DES following Shock wave therapy. The RCA had a new CTO in the mid vessel. This was successfully reopened and stented with DES. The PL branch remained occluded with good collaterals.   On follow up today he is feeling well. Denies any chest pain or SOB. He is back in the gym working out vigorously 5 days a week and then does heavy yard work on the weekend. He was started on Praluent in April. Eating a heart healthy diet.      Current Outpatient Medications on File Prior to Visit  Medication Sig Dispense Refill  . Alirocumab (PRALUENT) 75 MG/ML SOAJ Inject 1 mL  into the skin every 14 (fourteen) days. 6 mL 3  . aspirin 81 MG tablet Take 81 mg by mouth daily.    . Cetirizine HCl (ZYRTEC ALLERGY) 10 MG CAPS     . fluticasone (FLONASE) 50 MCG/ACT nasal spray     . methylPREDNISolone (MEDROL) 4 MG tablet Medrol dose pack. Take as instructed 21 tablet 0  . nitroGLYCERIN (NITROSTAT) 0.4 MG SL tablet Place 1 tablet (0.4 mg total) under the tongue every 5 (five) minutes as needed for chest pain. 25 tablet 3  . omeprazole (PRILOSEC) 20 MG capsule Take 20 mg by mouth daily as needed (acid reflux).    . rosuvastatin (CRESTOR) 40 MG tablet Take 1 tablet (40 mg total) by mouth daily. 90 tablet 3  . ticagrelor (BRILINTA) 90 MG TABS tablet Take 1 tablet (90 mg total) by mouth 2 (two) times daily. 60 tablet 11   No current facility-administered medications on file prior to visit.    No Known Allergies  Past Medical History:  Diagnosis Date  . Allergy   . Alpha thalassemia (HCC)   . Coronary artery disease    a.  s/p DES to prox LAD 2010. b. s/p CTO PCI of RCA and DES to LAD in 11/2019.  Marland Kitchen Dyslipidemia   . GERD (gastroesophageal reflux disease)   . Microcytosis    mild  anemia/thrombocytopenia noted on labs with microcytosis  . Non Q wave myocardial infarction (HCC) 06/2008   stent  . Right inguinal hernia 11/17/2017    Past Surgical History:  Procedure Laterality Date  . CARDIOVASCULAR STRESS TEST  10/24/2009   EF 60%  . CORONARY STENT INTERVENTION N/A 12/17/2019   Procedure: CORONARY STENT INTERVENTION;  Surgeon: Sw[place]iland, Mackensey Bolte M, MD;  Location: [facility] INVASIVE CV LAB;  Service: Cardiovascular;  Laterality: N/A;  . CORONARY STENT PLACEMENT  06/28/2008   SUCCESSFUL INTRACORONARY STENTING OF THE PROXIMAL LEFT ANTERIOR DESCENDING WITH THROMBUS EXTRACTION. EF 65%  . fracture arm    . INGUINAL HERNIA REPAIR Right 11/17/2017   Procedure: OPEN RIGHT INGUINAL HERNIA REPAIR;  Surgeon: Claud Kelp, MD;  Location: [facility];  Service: General;  Laterality:  Right;  . INSERTION OF MESH Right 11/17/2017   Procedure: INSERTION OF MESH;  Surgeon: Claud Kelp, MD;  Location: [facility];  Service: General;  Laterality: Right;  . INTRAVASCULAR ULTRASOUND/IVUS N/A 12/17/2019   Procedure: Intravascular Ultrasound/IVUS;  Surgeon: Sw[place]iland, Samatha Anspach M, MD;  Location: [facility] INVASIVE CV LAB;  Service: Cardiovascular;  Laterality: N/A;  . KNEE ARTHROSCOPY    . 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  Family History  Problem Relation Age of Onset  . Heart attack Mother   . Colon cancer Neg Hx     Review of Systems:  All other systems were reviewed and are negative.  Physical Exam: BP 124/60   Pulse 73   Ht 5\' 7"  (1.702 m)   Wt 157 lb 12.8 oz (71.6 kg)   BMI 24.71 kg/m  GENERAL:  Well appearing male in NAD HEENT:  PERRL, EOMI, sclera are clear. Oropharynx is clear. NECK:  No jugular venous distention, carotid upstroke brisk and symmetric, no bruits, no thyromegaly or adenopathy LUNGS:  Clear to auscultation bilaterally CHEST:  Unremarkable HEART:  RRR,  PMI not displaced or sustained,S1 and S2 within normal limits, no S3, no S4: no clicks, no rubs, no murmurs ABD:  Soft, nontender. BS +, no masses or bruits. No hepatomegaly, no splenomegaly EXT:  2 + pulses throughout, no edema, no cyanosis no clubbing SKIN:  Warm and dry.  No rashes NEURO:  Alert and oriented x 3. Cranial nerves II through XII intact. PSYCH:  Cognitively intact   LABORATORY DATA:  Lab Results  Component Value Date   WBC 9.2 12/18/2019   HGB 11.2 (L) 12/18/2019   HCT 37.2 (L)  12/18/2019   PLT 130 (L) 12/18/2019   GLUCOSE 111 (H) 12/18/2019   CHOL 102 01/17/2020   TRIG 51 01/17/2020   HDL 42 01/17/2020   LDLCALC 48 01/17/2020   ALT 23 06/18/2018   AST 24 06/18/2018   NA 139 12/18/2019   K 3.7 12/18/2019   CL 105 12/18/2019   CREATININE 0.71 12/18/2019   BUN 8 12/18/2019   CO2 27 12/18/2019   INR 1.0 06/27/2008     Dated 06/09/15: cholesterol 117, triglycerides 77, LDL 71, HDL 31. A1c 4.5%. CMET, CBC, TSH normal Dated 06/14/16: cholesterol 117, triglycerides  65, HDL 41, LDL 63, A1c 4.3%. Hgb 12.3. Chemistries normal. Dated 06/20/17: cholesterol 101, triglycerides 64, HDL 37, LDL 51, plts 131K. T. Bili 2.6. Other chemistries and CBC normal. TSH normal. Dated 06/26/18: cholesterol 124, triglycerides 73, HDL 36, LDL 73. A1c 4.5%. plts 128K. Other CMET, CBC, TSH normal.  Dated 07/14/19: cholesterol 124, triglycerides 55, HDL 44, LDL 69. A1c 4.7%. Normal CMET and CBC.  Dated 07/19/20: A1c 4.8%. cholesterol 118, triglycerides 66, HDL 46, LDL 59. Normal CMET  Ecg today shows NSR rate 73. Normal. I have personally reviewed and interpreted this study.   ETT 05/15/16:Study Highlights    There was no ST segment deviation noted during stress.   ETT with excellent exercise tolerance (14:01); no chest pain; normal BP response; no ST changes; negative adequate ETT.   Stress Findings   ECG Baseline ECG exhibits normal sinus rhythm..    Stress Findings The patient exercised following the Bruce protocol.  The patient reported no symptoms during the stress test. The patient experienced no angina during the stress test.   The test was stopped because the patient complained of fatigue.   Blood pressure and heart rate demonstrated a normal response to exercise. Overall, the patient's exercise capacity was excellent.   85% of maximum heart rate was achieved after 8 minutes. Recovery time: 15 minutes.  Duke Treadmill Score: intermediate risk The patient's response to  exercise was adequate for diagnosis.    Response to Stress There was no ST segment deviation noted during stress.  Arrhythmias during stress: none.  Arrhythmias during recovery: none.  There were no significant arrhythmias noted during the test.  ECG was interpretable and there was no significant change from baseline.    Stress Measurements   Baseline Vitals  Rest HR 85 bpm    Rest BP 117/74 mmHg    Exercise Time  Exercise duration (min) 14 min    Exercise duration (sec) 1 sec    Peak Stress Vitals  Peak HR 190 bpm    Peak BP 129/67 mmHg    Exercise Data  MPHR 165 bpm    Percent HR 115 %    RPE 16     Estimated workload 17.2 METS       ETT 11/02/19: Study Highlights   Blood pressure demonstrated a normal response to exercise.  There was 2mm of horizontal to upsloping ST segment depression in the inferolateral leads at peak exercise which became downsloping in recovery.  Positive exercise stress test for ischemia.  The patient exercised for 10 minutes reahcing 107% MPHR.  Excellent exercise capacity achieving 13.4 mets.  Consider further evaluation with coronary CTA or Nuclear stress testing.  Cardiac cath 12/17/19:  CORONARY STENT INTERVENTION  Intravascular Ultrasound/IVUS  LEFT HEART CATH AND CORONARY ANGIOGRAPHY  Conclusion    Prox LAD to Mid LAD lesion is 25% stenosed.  Prox LAD lesion is 90% stenosed.  Mid LAD lesion is 50% stenosed.  A drug-eluting stent was successfully placed using a SYNERGY XD 3.0X32.  Post intervention, there is a 0% residual stenosis.  1st Mrg lesion is 40% stenosed.  Prox RCA lesion is 50% stenosed.  Prox RCA to Mid RCA lesion is 100% stenosed.  A drug-eluting stent was successfully placed using a SYNERGY XD 2.50X48.  Post intervention, there is a 0% residual stenosis.  Mid RCA to Dist RCA lesion is 100% stenosed.  Post intervention, there is a 0% residual stenosis.  Post intervention, there is a 0%  residual stenosis.  Post  intervention, there is a 0% residual stenosis.  The left ventricular systolic function is normal.  LV end diastolic pressure is normal.  The left ventricular ejection fraction is 55-65% by visual estimate.   1. Severe 2 vessel obstructive CAD    -90% proximal LAD at prior stent margin. 50% stenosis distal to stent with severe stenosis by IVUS with 360 degree ring of calcification.    - 100% CTO of the mid RCA.  2. Normal LV function.  3. Normal LVEDP 4. Successful CTO PCI of the mid RCA. This restored flow into a large RV marginal branch. DES x 1 with 2.5 x 48 mm Synergy stent post dilated to 2.75 mm. The terminal RCA remained occluded with collateral flow but this only involves a rather small PL branch. The inferior wall is supplied by a wrap around LAD 5. Successful PCI of the proximal to mid LAD with Shock wave therapy for the severely calcified mid lesion. DES x 1 with 3.0 x 32 mm Onyx post dilated to 3.5 mm  Plan: DAPT for at least one year. I would favor long term DAPT since he has multiple stent layers in the LAD and a very long stent in the RCA. Will monitor overnight. Anticipate DC in am.    Assessment / Plan: 1. Coronary disease status post stenting of the proximal LAD in February 2010 with a 3.0 x 15 mm Xience stent. He had residual moderate disease in the LCx and RCA. ETT in Dec. 2017 was normal. He presented with  exertional symptoms c/w angina. ETT showed good exercise tolerance but has decreased from before and he has ST depression that is new. Cardiac cath showed severe stenosis in the proximal and mid LAD and CTO of the mid RCA. He underwent successful DES of the LAD 3.0 x 32 Synergy with Shockwave therapy. The CTO of the RCA was opened and stented with a 2.5 x 48 mm Synergy establishing flow in a large RV branch. A relatively small PL branch was occluded with collaterals. The LAD wraps around the apex and supplies the inferior wall so there was no  significant PDA coming off the RCA. He will remain on DAPT indefinitely. Will plan on switching Brilinta to Plavix at the end of July. He would also like to repeat a ETT now and I think this is reasonable.   2. Hyperlipidemia on Crestor- He is on maximal dose Crestor. Given his premature CAD and very high CV risk I think we need to target a more aggressive lipid goal of LDL less than 55. Last LDLs 69>>48>>59. Now on Praluent. Labs repeated this am.

## 2020-10-09 ENCOUNTER — Other Ambulatory Visit: Payer: Self-pay

## 2020-10-09 ENCOUNTER — Ambulatory Visit: Payer: 59 | Admitting: Cardiology

## 2020-10-09 ENCOUNTER — Encounter: Payer: Self-pay | Admitting: Cardiology

## 2020-10-09 VITALS — BP 124/60 | HR 73 | Ht 67.0 in | Wt 157.8 lb

## 2020-10-09 DIAGNOSIS — I2510 Atherosclerotic heart disease of native coronary artery without angina pectoris: Secondary | ICD-10-CM

## 2020-10-09 DIAGNOSIS — E785 Hyperlipidemia, unspecified: Secondary | ICD-10-CM

## 2020-10-09 DIAGNOSIS — I214 Non-ST elevation (NSTEMI) myocardial infarction: Secondary | ICD-10-CM

## 2020-10-09 DIAGNOSIS — I25118 Atherosclerotic heart disease of native coronary artery with other forms of angina pectoris: Secondary | ICD-10-CM | POA: Diagnosis not present

## 2020-10-09 LAB — LIPID PANEL
Chol/HDL Ratio: 1.5 ratio (ref 0.0–5.0)
Cholesterol, Total: 74 mg/dL — ABNORMAL LOW (ref 100–199)
HDL: 49 mg/dL (ref >39)
LDL Chol Calc (NIH): 13 mg/dL (ref 0–99)
Triglycerides: 43 mg/dL (ref 0–149)
VLDL Cholesterol Cal: 12 mg/dL (ref 5–40)

## 2020-10-09 NOTE — Patient Instructions (Signed)
We will schedule you for a stress test  

## 2020-10-24 ENCOUNTER — Inpatient Hospital Stay (HOSPITAL_COMMUNITY): Admission: RE | Admit: 2020-10-24 | Payer: 59 | Source: Ambulatory Visit

## 2020-10-24 ENCOUNTER — Telehealth (HOSPITAL_COMMUNITY): Payer: Self-pay | Admitting: *Deleted

## 2020-10-24 NOTE — Telephone Encounter (Signed)
Close encounter 

## 2020-10-25 ENCOUNTER — Ambulatory Visit (HOSPITAL_COMMUNITY)
Admission: RE | Admit: 2020-10-25 | Discharge: 2020-10-25 | Disposition: A | Discharge status: Home / Self Care | Payer: 59 | Source: Ambulatory Visit | Attending: Cardiovascular Disease | Admitting: Cardiovascular Disease

## 2020-10-25 ENCOUNTER — Encounter (HOSPITAL_COMMUNITY): Payer: Self-pay | Admitting: *Deleted

## 2020-10-25 ENCOUNTER — Other Ambulatory Visit: Payer: Self-pay

## 2020-10-25 DIAGNOSIS — E785 Hyperlipidemia, unspecified: Secondary | ICD-10-CM

## 2020-10-25 DIAGNOSIS — I25118 Atherosclerotic heart disease of native coronary artery with other forms of angina pectoris: Secondary | ICD-10-CM | POA: Diagnosis present

## 2020-10-25 LAB — EXERCISE TOLERANCE TEST
Estimated workload: 13.9 METS
Exercise duration (min): 12 min
Exercise duration (sec): 18 s
MPHR: 160 {beats}/min
Peak HR: 184 {beats}/min
Percent HR: 115 %
Rest HR: 82 {beats}/min

## 2020-10-25 NOTE — Progress Notes (Unsigned)
Abnormal ETT was reviewed by Dr. Flora Lipps. Patient was given permission to be discharged to go home.

## 2020-12-04 ENCOUNTER — Telehealth: Payer: Self-pay | Admitting: Cardiology

## 2020-12-04 MED ORDER — PANTOPRAZOLE SODIUM 40 MG PO TBEC: 40.0000 mg | DELAYED_RELEASE_TABLET | Freq: Every day | ORAL | 3 refills | Status: DC

## 2020-12-04 MED ORDER — CLOPIDOGREL BISULFATE 75 MG PO TABS: 75.0000 mg | ORAL_TABLET | Freq: Every day | ORAL | 3 refills | Status: DC

## 2020-12-04 NOTE — Telephone Encounter (Signed)
Yes I would like to switch him to Plavix 75 mg daily. He does not need a loading dose.  Dominque Levandowski [place] MD, [facility]

## 2020-12-04 NOTE — Telephone Encounter (Signed)
Yes I guess we should also switch prilosec to Protonix 40 mg daily  Nissan Frazzini [place] MD, [facility]

## 2020-12-04 NOTE — Telephone Encounter (Signed)
Returned call to patient, made patient aware of Dr. Elvis Coil recommendations. Omeprazole and Brilinta removed from patients medication list. Protonix 40 mg Daily and Plavix 75 mg Daily sent to patient preferred pharmacy. Patient aware of all instructions.   Advised patient to call back to office with any issues, questions, or concerns. Patient verbalized understanding.

## 2020-12-04 NOTE — Telephone Encounter (Signed)
Returned call to patient, made patient aware of Dr. Elvis Coil recommendations. Patient would like refills after this first 30 days sent to [facility].  Patient aware of instructions for Plavix 75mg  Daily (12) hours after last dose of Brilinta per pharmacy.   Patient is currently taking Omprazole- will forward to MD to make sure it's okay to proceed with Plavix script. Prescription order pended to chart to be signed.

## 2020-12-04 NOTE — Telephone Encounter (Signed)
Returned call to patient, patient states that he has enough Brilinta to last him until Sunday 7/24 but then will be out of prescription. Patient states that Dr. Swaziland had mentioned switching him to Plavix at the end of July. Patient states if Dr. Swaziland would like to do this then he would like first month supply of Plavix sent to [facility] and then further refills sent to [facility] mail order service. Advised patient that I would forward message to Dr. Swaziland for him to review and advise. Patient verbalized understanding.

## 2020-12-04 NOTE — Telephone Encounter (Signed)
Pt c/o medication issue:  1. Name of Medication: ticagrelor (BRILINTA) 90 MG TABS tablet  2. How are you currently taking this medication (dosage and times per day)? 1 tablet twice a day  3. Are you having a reaction (difficulty breathing--STAT)? no  4. What is your medication issue? Patient calling to see if the medication is supposed to change. He states he thinks the medication may be changed to plavix. If he is meant to stay on brilinta it can be sent to [facility].

## 2020-12-12 ENCOUNTER — Ambulatory Visit: Payer: 59 | Admitting: Family Medicine

## 2020-12-12 ENCOUNTER — Other Ambulatory Visit: Payer: Self-pay

## 2020-12-12 ENCOUNTER — Encounter: Payer: Self-pay | Admitting: Family Medicine

## 2020-12-12 ENCOUNTER — Ambulatory Visit: Payer: Self-pay

## 2020-12-12 DIAGNOSIS — M79645 Pain in left finger(s): Secondary | ICD-10-CM

## 2020-12-12 NOTE — Patient Instructions (Signed)
   For arthritis:   Glucosamine Sulfate 1,000 mg twice daily  Turmeric:  500 mg twice daily    

## 2020-12-12 NOTE — Progress Notes (Signed)
Office Visit Note   Patient: Bob Adams           Date of Birth: [birth year]           MRN: [id number] Visit Date: 12/12/2020 Requested by: Cleatis Polka., MD [address] [place],  [place] [address] PCP: Cleatis Polka., MD  Subjective: Chief Complaint  Patient presents with   Left Middle Finger - Pain    Pain and swelling in the left middle finger, PIP joint x 4 weeks. Started with pushing himself up off the floor with his hands, much of the weight being on the fingers. No pop happened, but he "knew something was not right." Writes with the right hand but does most activities with the left hand. Wears a splint on the finger for protection sometimes.     HPI: He is here with left third finger pain.  About a month ago he was sitting on the floor, he pushed himself up off the floor and felt a sharp severe pain in his third finger at the PIP joint.  It has been hurting since then and has been a little bit swollen.  He has been wearing a splint with some improvement.  He is right-hand dominant but uses his left hand quite a bit.  No previous problems with his finger.  He has a history of cardiovascular disease.  He takes good care of himself, exercises regularly and tries to eat healthfully.                ROS:   All other systems were reviewed and are negative.  Objective: Vital Signs: There were no vitals taken for this visit.  Physical Exam:  General:  Alert and oriented, in no acute distress. Pulm:  Breathing unlabored. Psy:  Normal mood, congruent affect. Skin: There is a resolving poison oak rash on the palms of his hands. Left third finger: He has tenderness on the radial side of his PIP joint.  It hurts to stress the radial collateral ligament but his ligaments feel stable.  Flexor and extensor mechanisms are intact.  Imaging: XR Finger Middle Left  Result Date: 12/12/2020 X-rays of the left third finger reveal early degenerative spurring at the PIP joint  with no obvious acute fracture.    Limited diagnostic ultrasound reveals a tiny avulsion fragment from the middle phalanx on the radial side of the PIP joint.  The deep fibers of the collateral ligament are intact.    Assessment & Plan: Healing left third finger PIP radial collateral ligament sprain with early arthritis -Okay to start range of motion exercises to tolerance.  Gentle grip strength exercises.  Anticipate 3-4 more weeks healing time.  Follow-up as needed. - Glucosamine and turmeric for arthritis.     Procedures: No procedures performed        PMFS History: Patient Active Problem List   Diagnosis Date Noted   Gilberts syndrome 08/01/2020   Encounter for general adult medical examination without abnormal findings 08/01/2020   Microcytic anemia 12/18/2019   Thrombocytopenia (HCC) 12/18/2019   Angina pectoris (HCC) 12/17/2019   Right inguinal hernia 11/17/2017   Trigger thumb, right thumb 08/18/2017   Benign paroxysmal positional vertigo 06/21/2016   Bilateral groin pain 01/11/2013   Asthma 09/03/2011   Atherosclerotic heart disease of native coronary artery without angina pectoris    Dyslipidemia    Allergic rhinitis 08/21/2009   Coronary angioplasty status 05/15/2009   Impaired fasting glucose 05/15/2009   Gastro-esophageal  reflux disease without esophagitis 05/08/2009   Hyperlipidemia 05/08/2009   Non Q wave myocardial infarction [facility]) 06/20/2008   Past Medical History:  Diagnosis Date   Allergy    Alpha thalassemia (HCC)    Coronary artery disease    a.  s/p DES to prox LAD 2010. b. s/p CTO PCI of RCA and DES to LAD in 11/2019.   Dyslipidemia    GERD (gastroesophageal reflux disease)    Microcytosis    mild anemia/thrombocytopenia noted on labs with microcytosis   Non Q wave myocardial infarction [facility]) 06/2008   stent   Right inguinal hernia 11/17/2017    Family History  Problem Relation Age of Onset   Heart attack Mother    Colon cancer Neg Hx      Past Surgical History:  Procedure Laterality Date   CARDIOVASCULAR STRESS TEST  10/24/2009   EF 60%   CORONARY STENT INTERVENTION N/A 12/17/2019   Procedure: CORONARY STENT INTERVENTION;  Surgeon: Swaziland, Peter M, MD;  Location: [facility] INVASIVE CV LAB;  Service: Cardiovascular;  Laterality: N/A;   CORONARY STENT PLACEMENT  06/28/2008   SUCCESSFUL INTRACORONARY STENTING OF THE PROXIMAL LEFT ANTERIOR DESCENDING WITH THROMBUS EXTRACTION. EF 65%   fracture arm     INGUINAL HERNIA REPAIR Right 11/17/2017   Procedure: OPEN RIGHT INGUINAL HERNIA REPAIR;  Surgeon: Claud Kelp, MD;  Location: [facility];  Service: General;  Laterality: Right;   INSERTION OF MESH Right 11/17/2017   Procedure: INSERTION OF MESH;  Surgeon: Claud Kelp, MD;  Location: [facility];  Service: General;  Laterality: Right;   INTRAVASCULAR ULTRASOUND/IVUS N/A 12/17/2019   Procedure: Intravascular Ultrasound/IVUS;  Surgeon: Sw[place]iland, Peter M, MD;  Location: [facility] INVASIVE CV LAB;  Service: Cardiovascular;  Laterality: N/A;   KNEE ARTHROSCOPY     LEFT HEART CATH AND CORONARY ANGIOGRAPHY N/A 12/17/2019   Procedure: LEFT HEART CATH AND CORONARY ANGIOGRAPHY;  Surgeon: [place]il[place], Peter M, MD;  Location: [facility] INVASIVE CV LAB;  Service: Cardiovascular;  Laterality: N/A;   Social History   Occupational History   Occupation: Garment/textile technologist: [employer]    Comment[employer]  Tobacco Use   Smoking status: Never   Smokeless tobacco: Never  Vaping Use   Vaping Use: Never used  Substance and Sexual Activity   Alcohol use: No   Drug use: No   Sexual activity: Not on file

## 2021-02-23 ENCOUNTER — Other Ambulatory Visit: Payer: Self-pay | Admitting: Cardiology

## 2021-03-31 ENCOUNTER — Other Ambulatory Visit: Payer: Self-pay | Admitting: Cardiology

## 2021-05-07 ENCOUNTER — Ambulatory Visit (INDEPENDENT_AMBULATORY_CARE_PROVIDER_SITE_OTHER): Payer: 59

## 2021-05-07 ENCOUNTER — Other Ambulatory Visit: Payer: Self-pay

## 2021-05-07 DIAGNOSIS — Z23 Encounter for immunization: Secondary | ICD-10-CM | POA: Diagnosis not present

## 2021-05-25 ENCOUNTER — Ambulatory Visit: Payer: 59 | Admitting: Cardiology

## 2021-05-28 NOTE — Progress Notes (Signed)
[name] Date of Birth: [birth year]   History of Present Illness: Ruddy is seen today for followup CAD. He is status post stenting of the proximal LAD in 2010 with a drug-eluting stent- 3.0 x 15 mm Xience. He also had 70% RCA disease and 50% LCx disease at that time.  Stress Echo in June 2011 was normal. ETT in Dec. 2017 was normal.   In April 2021 he began a month long fast for Ramadan. He lost weight which is typical. He did not exercise during this time and really didn't resume exercise for 2 weeks after to regain some strength and weight.  He states that when he resumed exercise he noted a significant difference in his HR. Before he could get his HR up to 160-170. But now HR didn't go over 126. He developed a sensation in the left pectoral region that felt like "an injury that is healing". If he let his HR drop the symptoms resolved. After 10 minute warm up symptoms would improve and he could continue exercise. He felt that something clearly has changed from before. An ETT was ordered. While exercise tolerance was still good it was not as good as before and he did develop new ST depression.     He subsequently underwent cardiac cath showing severe 2 vessel CAD with  Progressive LAD disease just proximal to the old stent and there was a calcified lesion distal to the stent in the mid LAD. This was successfully treated with DES following Shock wave therapy. The RCA had a new CTO in the mid vessel. This was successfully reopened and stented with DES. The PL branch remained occluded with good collaterals. In June 2022 he had follow up ETT that showed a significant improvement in exercise tolerance and Ecg changes compared to prior.   On follow up today he is feeling well. Denies any chest pain or SOB. He is back in the gym working out vigorously 5 days a week. He also does heavy yard work. He is tolerating medication well. He has occasional pain in the left neck- not with exertion and  lasting only minutes. Eating a heart healthy diet.      Current Outpatient Medications on File Prior to Visit  Medication Sig Dispense Refill   Alirocumab (PRALUENT) 75 MG/ML SOAJ Inject 1 mL into the skin every 14 (fourteen) days. 6 mL 3   aspirin 81 MG tablet Take 81 mg by mouth daily.     Cetirizine HCl (ZYRTEC ALLERGY) 10 MG CAPS      clopidogrel (PLAVIX) 75 MG tablet TAKE ONE TABLET BY MOUTH DAILY 90 tablet 1   fluticasone (FLONASE) 50 MCG/ACT nasal spray      nitroGLYCERIN (NITROSTAT) 0.4 MG SL tablet Place 1 tablet (0.4 mg total) under the tongue every 5 (five) minutes as needed for chest pain. 25 tablet 3   pantoprazole (PROTONIX) 40 MG tablet Take 1 tablet (40 mg total) by mouth daily. 90 tablet 3   rosuvastatin (CRESTOR) 40 MG tablet TAKE 1 TABLET DAILY 90 tablet 3   No current facility-administered medications on file prior to visit.    Allergies  Allergen Reactions   Sulfa Antibiotics Other (See Comments)    Past Medical History:  Diagnosis Date   Allergy    Alpha thalassemia (HCC)    Coronary artery disease    a.  s/p DES to prox LAD 2010. b. s/p CTO PCI of RCA and DES to LAD in 11/2019.   Dyslipidemia  GERD (gastroesophageal reflux disease)    Microcytosis    mild anemia/thrombocytopenia noted on labs with microcytosis   Non Q wave myocardial infarction [facility](HCC) 06/2008   stent   Right inguinal hernia 11/17/2017    Past Surgical History:  Procedure Laterality Date   CARDIOVASCULAR STRESS TEST  10/24/2009   EF 60%   CORONARY STENT INTERVENTION N/A 12/17/2019   Procedure: CORONARY STENT INTERVENTION;  Surgeon: SwazilandJordan, Cam Dauphin M, MD;  [facility];  Service: Cardiovascular;  Laterality: N/A;   CORONARY STENT PLACEMENT  06/28/2008   SUCCESSFUL INTRACORONARY STENTING OF THE PROXIMAL LEFT ANTERIOR DESCENDING WITH THROMBUS EXTRACTION. EF 65%   fracture arm     INGUINAL HERNIA REPAIR Right 11/17/2017   Procedure: OPEN RIGHT INGUINAL HERNIA REPAIR;  Surgeon:  Claud KelpIngram, Haywood, MD;  Location: [facility];  Service: General;  Laterality: Right;   INSERTION OF MESH Right 11/17/2017   Procedure: INSERTION OF MESH;  Surgeon: Claud KelpIngram, Haywood, MD;  Location: [facility];  Service: General;  Laterality: Right;   INTRAVASCULAR ULTRASOUND/IVUS N/A 12/17/2019   Procedure: Intravascular Ultrasound/IVUS;  Surgeon: SwazilandJordan, Shadasia Oldfield M, MD;  [facility];  Service: Cardiovascular;  Laterality: N/A;   KNEE ARTHROSCOPY     LEFT HEART CATH AND CORONARY ANGIOGRAPHY N/A 12/17/2019   Procedure: LEFT HEART CATH AND CORONARY ANGIOGRAPHY;  Surgeon: SwazilandJordan, Erving Sassano M, MD;  [facility];  Service: Cardiovascular;  Laterality: N/A;    Social History   Tobacco Use  Smoking Status Never  Smokeless Tobacco Never    Social History   Substance and Sexual Activity  Alcohol Use No  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  Family History  Problem Relation Age of Onset   Heart attack Mother    Colon cancer Neg Hx     Review of Systems:  All other systems were reviewed and are negative.  Physical Exam: BP 112/62    Pulse 84    Ht 5\' 6"  (1.676 m)    Wt 162 lb (73.5 kg)    SpO2 100%    BMI 26.15 kg/m  GENERAL:  Well appearing male in NAD HEENT:  PERRL, EOMI, sclera are clear. Oropharynx is clear. NECK:  No jugular venous distention, carotid upstroke brisk and symmetric, no bruits, no thyromegaly or adenopathy LUNGS:  Clear to auscultation bilaterally CHEST:  Unremarkable HEART:  RRR,  PMI not displaced or sustained,S1 and S2 within normal limits, no S3, no S4: no clicks, no rubs, no murmurs ABD:  Soft, nontender. BS +, no masses or bruits. No hepatomegaly, no splenomegaly EXT:  2 + pulses throughout, no edema, no cyanosis no clubbing SKIN:  Warm and dry.  No rashes NEURO:  Alert and oriented x 3. Cranial nerves II through XII intact. PSYCH:  Cognitively intact   LABORATORY DATA:  Lab Results  Component  Value Date   WBC 9.2 12/18/2019   HGB 11.2 (L) 12/18/2019   HCT 37.2 (L) 12/18/2019   PLT 130 (L) 12/18/2019   GLUCOSE 111 (H) 12/18/2019   CHOL 74 (L) 10/09/2020   TRIG 43 10/09/2020   HDL 49 10/09/2020   LDLCALC 13 10/09/2020   ALT 23 06/18/2018   AST 24 06/18/2018   NA 139 12/18/2019   K 3.7 12/18/2019   CL 105 12/18/2019   CREATININE 0.71 12/18/2019   BUN 8 12/18/2019   CO2 27 12/18/2019   INR 1.0 06/27/2008     Dated 06/09/15: cholesterol  117, triglycerides 77, LDL 71, HDL 31. A1c 4.5%. CMET, CBC, TSH normal Dated 06/14/16: cholesterol 117, triglycerides 65, HDL 41, LDL 63, A1c 4.3%. Hgb 12.3. Chemistries normal. Dated 06/20/17: cholesterol 101, triglycerides 64, HDL 37, LDL 51, plts 131K. T. Bili 2.6. Other chemistries and CBC normal. TSH normal. Dated 06/26/18: cholesterol 124, triglycerides 73, HDL 36, LDL 73. A1c 4.5%. plts 128K. Other CMET, CBC, TSH normal.  Dated 07/14/19: cholesterol 124, triglycerides 55, HDL 44, LDL 69. A1c 4.7%. Normal CMET and CBC.  Dated 07/19/20: A1c 4.8%. cholesterol 118, triglycerides 66, HDL 46, LDL 59. Normal CMET    ETT 05/15/16:Study Highlights   There was no ST segment deviation noted during stress.   ETT with excellent exercise tolerance (14:01); no chest pain; normal BP response; no ST changes; negative adequate ETT.    Stress Findings   ECG Baseline ECG exhibits normal sinus rhythm..    Stress Findings The patient exercised following the Bruce protocol.  The patient reported no symptoms during the stress test. The patient experienced no angina during the stress test.   The test was stopped because the patient complained of fatigue.   Blood pressure and heart rate demonstrated a normal response to exercise. Overall, the patient's exercise capacity was excellent.   85% of maximum heart rate was achieved after 8 minutes. Recovery time: 15 minutes.  Duke Treadmill Score: intermediate risk The patient's response to exercise was adequate  for diagnosis.    Response to Stress There was no ST segment deviation noted during stress.  Arrhythmias during stress: none.  Arrhythmias during recovery: none.  There were no significant arrhythmias noted during the test.  ECG was interpretable and there was no significant change from baseline.    Stress Measurements   Baseline Vitals  Rest HR 85 bpm    Rest BP 117/74 mmHg    Exercise Time  Exercise duration (min) 14 min    Exercise duration (sec) 1 sec    Peak Stress Vitals  Peak HR 190 bpm    Peak BP 129/67 mmHg    Exercise Data  MPHR 165 bpm    Percent HR 115 %    RPE 16     Estimated workload 17.2 METS       ETT 11/02/19: Study Highlights  Blood pressure demonstrated a normal response to exercise. There was 2mm of horizontal to upsloping ST segment depression in the inferolateral leads at peak exercise which became downsloping in recovery. Positive exercise stress test for ischemia. The patient exercised for 10 minutes reahcing 107% MPHR. Excellent exercise capacity achieving 13.4 mets. Consider further evaluation with coronary CTA or Nuclear stress testing.  Cardiac cath 12/17/19:  CORONARY STENT INTERVENTION  Intravascular Ultrasound/IVUS  LEFT HEART CATH AND CORONARY ANGIOGRAPHY  Conclusion    Prox LAD to Mid LAD lesion is 25% stenosed. Prox LAD lesion is 90% stenosed. Mid LAD lesion is 50% stenosed. A drug-eluting stent was successfully placed using a SYNERGY XD 3.0X32. Post intervention, there is a 0% residual stenosis. 1st Mrg lesion is 40% stenosed. Prox RCA lesion is 50% stenosed. Prox RCA to Mid RCA lesion is 100% stenosed. A drug-eluting stent was successfully placed using a SYNERGY XD 2.50X48. Post intervention, there is a 0% residual stenosis. Mid RCA to Dist RCA lesion is 100% stenosed. Post intervention, there is a 0% residual stenosis. Post intervention, there is a 0% residual stenosis. Post intervention, there is a 0% residual  stenosis. The left ventricular systolic function is normal. LV end  diastolic pressure is normal. The left ventricular ejection fraction is 55-65% by visual estimate.   1. Severe 2 vessel obstructive CAD    -90% proximal LAD at prior stent margin. 50% stenosis distal to stent with severe stenosis by IVUS with 360 degree ring of calcification.    - 100% CTO of the mid RCA.  2. Normal LV function.  3. Normal LVEDP 4. Successful CTO PCI of the mid RCA. This restored flow into a large RV marginal branch. DES x 1 with 2.5 x 48 mm Synergy stent post dilated to 2.75 mm. The terminal RCA remained occluded with collateral flow but this only involves a rather small PL branch. The inferior wall is supplied by a wrap around LAD 5. Successful PCI of the proximal to mid LAD with Shock wave therapy for the severely calcified mid lesion. DES x 1 with 3.0 x 32 mm Onyx post dilated to 3.5 mm   Plan: DAPT for at least one year. I would favor long term DAPT since he has multiple stent layers in the LAD and a very long stent in the RCA. Will monitor overnight. Anticipate DC in am.    ETT 10/25/20: Study Highlights    Patient exercised according to the CVN-BRUCE protocol for 12:84min achieving 13.9 METs The resting HR was 82bpm and rose to a max HR 184bpm which represents 115% of the max, age-predicted HR. ST segment depression was noted during stress in the II, III, aVF, V6 and V5 leads, and returning to baseline after less than 1 minute of recovery. Overall, patient had excellent exercise capacity but there were inferior and anterolateral ST depressions noted during stress that resolved within 1 minute of recovery. Consider further ischemic evaulation with myoview or coronary CTA.   Laurance Flatten, MD  Assessment / Plan: 1. Coronary disease status post stenting of the proximal LAD in February 2010 with a 3.0 x 15 mm Xience stent. He had residual moderate disease in the LCx and RCA. ETT in Dec. 2017 was normal.  He presented with  exertional symptoms c/w angina. ETT showed good exercise tolerance but has decreased from before and he has ST depression that is new. Cardiac cath showed severe stenosis in the proximal and mid LAD and CTO of the mid RCA. He underwent successful DES of the LAD 3.0 x 32 Synergy with Shockwave therapy. The CTO of the RCA was opened and stented with a 2.5 x 48 mm Synergy establishing flow in a large RV branch. A relatively small PL branch was occluded with collaterals. The LAD wraps around the apex and supplies the inferior wall so there was no significant PDA coming off the RCA. He will remain on DAPT indefinitely. Follow up ETT in June was significantly improved. Symptomatically he is doing very well.   2. Hyperlipidemia on Crestor- He is on maximal dose Crestor. Given his premature CAD and very high CV risk I think we need to target a more aggressive lipid goal of LDL. Last LDLs 69>>48>>59. Now on Praluent. Due for follow up lab work with Dr Clelia Croft.  I will follow up in 6 months.

## 2021-06-08 ENCOUNTER — Ambulatory Visit: Payer: 59 | Admitting: Cardiology

## 2021-06-08 ENCOUNTER — Other Ambulatory Visit: Payer: Self-pay

## 2021-06-08 ENCOUNTER — Encounter: Payer: Self-pay | Admitting: Cardiology

## 2021-06-08 VITALS — BP 112/62 | HR 84 | Ht 66.0 in | Wt 162.0 lb

## 2021-06-08 DIAGNOSIS — I25118 Atherosclerotic heart disease of native coronary artery with other forms of angina pectoris: Secondary | ICD-10-CM | POA: Diagnosis not present

## 2021-06-08 DIAGNOSIS — E785 Hyperlipidemia, unspecified: Secondary | ICD-10-CM

## 2021-07-02 ENCOUNTER — Other Ambulatory Visit: Payer: Self-pay | Admitting: Cardiology

## 2021-07-02 DIAGNOSIS — E785 Hyperlipidemia, unspecified: Secondary | ICD-10-CM

## 2021-07-02 DIAGNOSIS — I2510 Atherosclerotic heart disease of native coronary artery without angina pectoris: Secondary | ICD-10-CM

## 2021-09-24 ENCOUNTER — Encounter: Payer: Self-pay | Admitting: Cardiology

## 2021-09-27 ENCOUNTER — Other Ambulatory Visit: Payer: Self-pay | Admitting: Cardiology

## 2021-10-25 ENCOUNTER — Other Ambulatory Visit: Payer: Self-pay | Admitting: Cardiology

## 2021-11-23 MED ORDER — REPATHA SURECLICK 140 MG/ML ~~LOC~~ SOAJ: 1.0000 | SUBCUTANEOUS | 11 refills | Status: DC

## 2021-12-02 NOTE — Progress Notes (Unsigned)
Bob Adams Date of Birth: [birth year]   History of Present Illness: Bob Adams is seen today for followup CAD. He is status post stenting of the proximal LAD in 2010 with a drug-eluting stent- 3.0 x 15 mm Xience. He also had 70% RCA disease and 50% LCx disease at that time.  Stress Echo in June 2011 was normal. ETT in Dec. 2017 was normal.   In April 2021 he began a month long fast for Ramadan. He lost weight which is typical. He did not exercise during this time and really didn't resume exercise for 2 weeks after to regain some strength and weight.  He states that when he resumed exercise he noted a significant difference in his HR. Before he could get his HR up to 160-170. But now HR didn't go over 126. He developed a sensation in the left pectoral region that felt like "an injury that is healing". If he let his HR drop the symptoms resolved. After 10 minute warm up symptoms would improve and he could continue exercise. He felt that something clearly has changed from before. An ETT was ordered. While exercise tolerance was still good it was not as good as before and he did develop new ST depression.     He subsequently underwent cardiac cath showing severe 2 vessel CAD with  Progressive LAD disease just proximal to the old stent and there was a calcified lesion distal to the stent in the mid LAD. This was successfully treated with DES following Shock wave therapy. The RCA had a new CTO in the mid vessel. This was successfully reopened and stented with DES. The PL branch remained occluded with good collaterals. In June 2022 he had follow up ETT that showed a significant improvement in exercise tolerance and Ecg changes compared to prior.   On follow up today he is feeling well. Denies any chest pain or SOB. He is in the gym working out vigorously 5 days a week. He also does heavy yard work. He is tolerating medication well. Eating a heart healthy diet.  Reports lab work in March with Dr  Clelia Croft.    Current Outpatient Medications on File Prior to Visit  Medication Sig Dispense Refill   aspirin 81 MG tablet Take 81 mg by mouth daily.     Cetirizine HCl (ZYRTEC ALLERGY) 10 MG CAPS      Evolocumab (REPATHA SURECLICK) 140 MG/ML SOAJ Inject 1 Pen into the skin every 14 (fourteen) days. 2 mL 11   fluticasone (FLONASE) 50 MCG/ACT nasal spray      nitroGLYCERIN (NITROSTAT) 0.4 MG SL tablet Place 1 tablet (0.4 mg total) under the tongue every 5 (five) minutes as needed for chest pain. 25 tablet 3   pantoprazole (PROTONIX) 40 MG tablet Take 1 tablet (40 mg total) by mouth daily. (Patient taking differently: Take 40 mg by mouth as needed.) 90 tablet 3   rosuvastatin (CRESTOR) 40 MG tablet TAKE 1 TABLET DAILY 90 tablet 3   No current facility-administered medications on file prior to visit.    Allergies  Allergen Reactions   Sulfa Antibiotics Other (See Comments)    Past Medical History:  Diagnosis Date   Allergy    Alpha thalassemia (HCC)    Coronary artery disease    a.  s/p DES to prox LAD 2010. b. s/p CTO PCI of RCA and DES to LAD in 11/2019.   Dyslipidemia    GERD (gastroesophageal reflux disease)    Microcytosis    mild  anemia/thrombocytopenia noted on labs with microcytosis   Non Q wave myocardial infarction [facility]) 06/2008   stent   Right inguinal hernia 11/17/2017    Past Surgical History:  Procedure Laterality Date   CARDIOVASCULAR STRESS TEST  10/24/2009   EF 60%   CORONARY STENT INTERVENTION N/A 12/17/2019   Procedure: CORONARY STENT INTERVENTION;  Surgeon: Swazil[place], Jerika Wales M, MD;  Location: [facility] INVASIVE CV LAB;  Service: Cardiovascular;  Laterality: N/A;   CORONARY STENT PLACEMENT  06/28/2008   SUCCESSFUL INTRACORONARY STENTING OF THE PROXIMAL LEFT ANTERIOR DESCENDING WITH THROMBUS EXTRACTION. EF 65%   fracture arm     INGUINAL HERNIA REPAIR Right 11/17/2017   Procedure: OPEN RIGHT INGUINAL HERNIA REPAIR;  Surgeon: Claud Kelp, MD;  Location: [facility];  Service: General;  Laterality: Right;   INSERTION OF MESH Right 11/17/2017   Procedure: INSERTION OF MESH;  Surgeon: Claud Kelp, MD;  Location: [facility];  Service: General;  Laterality: Right;   INTRAVASCULAR ULTRASOUND/IVUS N/A 12/17/2019   Procedure: Intravascular Ultrasound/IVUS;  Surgeon: Swaziland, Phillp Dolores M, MD;  Location: [facility] INVASIVE CV LAB;  Service: Cardiovascular;  Laterality: N/A;   KNEE ARTHROSCOPY     LEFT HEART CATH AND CORONARY ANGIOGRAPHY N/A 12/17/2019   Procedure: LEFT HEART CATH AND CORONARY ANGIOGRAPHY;  Surgeon: Swazil[place], Syeda Prickett M, MD;  Location: [facility] INVASIVE CV LAB;  Service: Cardiovascular;  Laterality: N/A;    Social History   Tobacco Use  Smoking Status Never  Smokeless Tobacco Never    Social History   Substance and Sexual Activity  Alcohol Use No  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  Family History  Problem Relation Age of Onset   Heart attack Mother    Colon cancer Neg Hx     Review of Systems:  All other systems were reviewed and are negative.  Physical Exam: BP 136/80   Pulse 84   Ht 5\' 6"  (1.676 m)   Wt 159 lb (72.1 kg)   SpO2 98%   BMI 25.66 kg/m  GENERAL:  Well appearing male in NAD HEENT:  PERRL, EOMI, sclera are clear. Oropharynx is clear. NECK:  No jugular venous distention, carotid upstroke brisk and symmetric, no bruits, no thyromegaly or adenopathy LUNGS:  Clear to auscultation bilaterally CHEST:  Unremarkable HEART:  RRR,  PMI not displaced or sustained,S1 and S2 within normal limits, no S3, no S4: no clicks, no rubs, no murmurs ABD:  Soft, nontender. BS +, no masses or bruits. No hepatomegaly, no splenomegaly EXT:  2 + pulses throughout, no edema, no cyanosis no clubbing SKIN:  Warm and dry.  No rashes NEURO:  Alert and oriented x 3. Cranial nerves II through XII intact. PSYCH:  Cognitively intact   LABORATORY DATA:  Lab Results  Component Value Date   WBC 9.2 12/18/2019   HGB 11.2 (L)  12/18/2019   HCT 37.2 (L) 12/18/2019   PLT 130 (L) 12/18/2019   GLUCOSE 111 (H) 12/18/2019   CHOL 74 (L) 10/09/2020   TRIG 43 10/09/2020   HDL 49 10/09/2020   LDLCALC 13 10/09/2020   ALT 23 06/18/2018   AST 24 06/18/2018   NA 139 12/18/2019   K 3.7 12/18/2019   CL 105 12/18/2019   CREATININE 0.71 12/18/2019   BUN 8 12/18/2019   CO2 27 12/18/2019   INR 1.0 06/27/2008     Dated 06/09/15: cholesterol 117, triglycerides 77, LDL 71, HDL 31. A1c 4.5%. CMET, CBC, TSH normal Dated 06/14/16: cholesterol 117,  triglycerides 65, HDL 41, LDL 63, A1c 4.3%. Hgb 12.3. Chemistries normal. Dated 06/20/17: cholesterol 101, triglycerides 64, HDL 37, LDL 51, plts 131K. T. Bili 2.6. Other chemistries and CBC normal. TSH normal. Dated 06/26/18: cholesterol 124, triglycerides 73, HDL 36, LDL 73. A1c 4.5%. plts 128K. Other CMET, CBC, TSH normal.  Dated 07/14/19: cholesterol 124, triglycerides 55, HDL 44, LDL 69. A1c 4.7%. Normal CMET and CBC.  Dated 07/19/20: A1c 4.8%. cholesterol 118, triglycerides 66, HDL 46, LDL 59. Normal CMET  Ecg today shows NSR rate 84. Normal. I have personally reviewed and interpreted this study.  ETT 05/15/16:Study Highlights   There was no ST segment deviation noted during stress.   ETT with excellent exercise tolerance (14:01); no chest pain; normal BP response; no ST changes; negative adequate ETT.    Stress Findings   ECG Baseline ECG exhibits normal sinus rhythm..    Stress Findings The patient exercised following the Bruce protocol.  The patient reported no symptoms during the stress test. The patient experienced no angina during the stress test.   The test was stopped because the patient complained of fatigue.   Blood pressure and heart rate demonstrated a normal response to exercise. Overall, the patient's exercise capacity was excellent.   85% of maximum heart rate was achieved after 8 minutes. Recovery time: 15 minutes.  Duke Treadmill Score: intermediate risk The  patient's response to exercise was adequate for diagnosis.    Response to Stress There was no ST segment deviation noted during stress.  Arrhythmias during stress: none.  Arrhythmias during recovery: none.  There were no significant arrhythmias noted during the test.  ECG was interpretable and there was no significant change from baseline.    Stress Measurements   Baseline Vitals  Rest HR 85 bpm    Rest BP 117/74 mmHg    Exercise Time  Exercise duration (min) 14 min    Exercise duration (sec) 1 sec    Peak Stress Vitals  Peak HR 190 bpm    Peak BP 129/67 mmHg    Exercise Data  MPHR 165 bpm    Percent HR 115 %    RPE 16     Estimated workload 17.2 METS       ETT 11/02/19: Study Highlights  Blood pressure demonstrated a normal response to exercise. There was 25mm of horizontal to upsloping ST segment depression in the inferolateral leads at peak exercise which became downsloping in recovery. Positive exercise stress test for ischemia. The patient exercised for 10 minutes reahcing 107% MPHR. Excellent exercise capacity achieving 13.4 mets. Consider further evaluation with coronary CTA or Nuclear stress testing.  Cardiac cath 12/17/19:  CORONARY STENT INTERVENTION  Intravascular Ultrasound/IVUS  LEFT HEART CATH AND CORONARY ANGIOGRAPHY  Conclusion    Prox LAD to Mid LAD lesion is 25% stenosed. Prox LAD lesion is 90% stenosed. Mid LAD lesion is 50% stenosed. A drug-eluting stent was successfully placed using a SYNERGY XD 3.0X32. Post intervention, there is a 0% residual stenosis. 1st Mrg lesion is 40% stenosed. Prox RCA lesion is 50% stenosed. Prox RCA to Mid RCA lesion is 100% stenosed. A drug-eluting stent was successfully placed using a SYNERGY XD 2.50X48. Post intervention, there is a 0% residual stenosis. Mid RCA to Dist RCA lesion is 100% stenosed. Post intervention, there is a 0% residual stenosis. Post intervention, there is a 0% residual  stenosis. Post intervention, there is a 0% residual stenosis. The left ventricular systolic function is normal. LV end diastolic pressure is  normal. The left ventricular ejection fraction is 55-65% by visual estimate.   1. Severe 2 vessel obstructive CAD    -90% proximal LAD at prior stent margin. 50% stenosis distal to stent with severe stenosis by IVUS with 360 degree ring of calcification.    - 100% CTO of the mid RCA.  2. Normal LV function.  3. Normal LVEDP 4. Successful CTO PCI of the mid RCA. This restored flow into a large RV marginal branch. DES x 1 with 2.5 x 48 mm Synergy stent post dilated to 2.75 mm. The terminal RCA remained occluded with collateral flow but this only involves a rather small PL branch. The inferior wall is supplied by a wrap around LAD 5. Successful PCI of the proximal to mid LAD with Shock wave therapy for the severely calcified mid lesion. DES x 1 with 3.0 x 32 mm Onyx post dilated to 3.5 mm   Plan: DAPT for at least one year. I would favor long term DAPT since he has multiple stent layers in the LAD and a very long stent in the RCA. Will monitor overnight. Anticipate DC in am.    ETT 10/25/20: Study Highlights    Patient exercised according to the CVN-BRUCE protocol for 12:1min achieving 13.9 METs The resting HR was 82bpm and rose to a max HR 184bpm which represents 115% of the max, age-predicted HR. ST segment depression was noted during stress in the II, III, aVF, V6 and V5 leads, and returning to baseline after less than 1 minute of recovery. Overall, patient had excellent exercise capacity but there were inferior and anterolateral ST depressions noted during stress that resolved within 1 minute of recovery. Consider further ischemic evaulation with myoview or coronary CTA.   Laurance Flatten, MD  Assessment / Plan: 1. Coronary disease status post stenting of the proximal LAD in February 2010 with a 3.0 x 15 mm Xience stent. He had residual moderate  disease in the LCx and RCA. ETT in Dec. 2017 was normal. He presented with  exertional symptoms c/w angina. ETT showed good exercise tolerance but has decreased from before and he has ST depression that is new. Cardiac cath showed severe stenosis in the proximal and mid LAD and CTO of the mid RCA. He underwent successful DES of the LAD 3.0 x 32 Synergy with Shockwave therapy. The CTO of the RCA was opened and stented with a 2.5 x 48 mm Synergy establishing flow in a large RV branch. A relatively small PL branch was occluded with collaterals. The LAD wraps around the apex and supplies the inferior wall so there was no significant PDA coming off the RCA. He will remain on DAPT indefinitely. Follow up ETT in June 2022 was significantly improved. Symptomatically he is doing very well.   2. Hyperlipidemia on Crestor and PCSK 9 inhibitor.- He is on maximal dose Crestor. Given his premature CAD and very high CV risk I think we need to target a more aggressive lipid goal of LDL. Reports last LDL 25.  Now on Praluent. Planning to switch to Repatha due to insurance coverage.   I will follow up in 6 months.

## 2021-12-05 ENCOUNTER — Encounter: Payer: Self-pay | Admitting: Cardiology

## 2021-12-05 ENCOUNTER — Ambulatory Visit: Payer: 59 | Admitting: Cardiology

## 2021-12-05 VITALS — BP 136/80 | HR 84 | Ht 66.0 in | Wt 159.0 lb

## 2021-12-05 DIAGNOSIS — E785 Hyperlipidemia, unspecified: Secondary | ICD-10-CM | POA: Diagnosis not present

## 2021-12-05 DIAGNOSIS — I25118 Atherosclerotic heart disease of native coronary artery with other forms of angina pectoris: Secondary | ICD-10-CM | POA: Diagnosis not present

## 2021-12-05 MED ORDER — CLOPIDOGREL BISULFATE 75 MG PO TABS: 75.0000 mg | ORAL_TABLET | Freq: Every day | ORAL | 3 refills | Status: DC

## 2021-12-05 NOTE — Patient Instructions (Signed)
Medication Instructions:  Your physician recommends that you continue on your current medications as directed. Please refer to the Current Medication list given to you today.  *If you need a refill on your cardiac medications before your next appointment, please call your pharmacy*   Lab Work: NONE If you have labs (blood work) drawn today and your tests are completely normal, you will receive your results only by: MyChart Message (if you have MyChart) OR A paper copy in the mail If you have any lab test that is abnormal or we need to change your treatment, we will call you to review the results.   Testing/Procedures: NONE   Follow-Up: At [facility], you and your health needs are our priority.  As part of our continuing mission to provide you with exceptional heart care, we have created designated Provider Care Teams.  These Care Teams include your primary Cardiologist (physician) and Advanced Practice Providers (APPs -  Physician Assistants and Nurse Practitioners) who all work together to provide you with the care you need, when you need it.  Your next appointment:   6 month(s)  The format for your next appointment:   In Person  Provider:   [name], MD

## 2022-03-09 ENCOUNTER — Other Ambulatory Visit: Payer: Self-pay | Admitting: Cardiology

## 2022-06-04 NOTE — Progress Notes (Unsigned)
Bob Adams Date of Birth: 08/18/1960   History of Present Illness: Bob Adams is seen today for followup CAD. He is status post stenting of the proximal LAD in 2010 with a drug-eluting stent- 3.0 x 15 mm Xience. He also had 70% RCA disease and 50% LCx disease at that time.  Stress Echo in June 2011 was normal. ETT in Dec. 2017 was normal.   In April 2021 he began a month long fast for Ramadan. He lost weight which is typical. He did not exercise during this time and really didn't resume exercise for 2 weeks after to regain some strength and weight.  He states that when he resumed exercise he noted a significant difference in his HR. Before he could get his HR up to 160-170. But now HR didn't go over 126. He developed a sensation in the left pectoral region that felt like "an injury that is healing". If he let his HR drop the symptoms resolved. After 10 minute warm up symptoms would improve and he could continue exercise. He felt that something clearly has changed from before. An ETT was ordered. While exercise tolerance was still good it was not as good as before and he did develop new ST depression.     He subsequently underwent cardiac cath showing severe 2 vessel CAD with  Progressive LAD disease just proximal to the old stent and there was a calcified lesion distal to the stent in the mid LAD. This was successfully treated with DES following Shock wave therapy. The RCA had a new CTO in the mid vessel. This was successfully reopened and stented with DES. The PL branch remained occluded with good collaterals. In June 2022 he had follow up ETT that showed a significant improvement in exercise tolerance and Ecg changes compared to prior.   On follow up today he is feeling well. Denies any chest pain or SOB. He is in the gym working out vigorously 5 days a week. He also does heavy yard work. He is tolerating medication well. Eating a heart healthy diet.  Reports lab work in March with Dr  Brigitte Pulse.    Current Outpatient Medications on File Prior to Visit  Medication Sig Dispense Refill   aspirin 81 MG tablet Take 81 mg by mouth daily.     Cetirizine HCl (ZYRTEC ALLERGY) 10 MG CAPS      clopidogrel (PLAVIX) 75 MG tablet Take 1 tablet (75 mg total) by mouth daily. 90 tablet 3   Evolocumab (REPATHA SURECLICK) 433 MG/ML SOAJ Inject 1 Pen into the skin every 14 (fourteen) days. 2 mL 11   fluticasone (FLONASE) 50 MCG/ACT nasal spray      nitroGLYCERIN (NITROSTAT) 0.4 MG SL tablet Place 1 tablet (0.4 mg total) under the tongue every 5 (five) minutes as needed for chest pain. 25 tablet 3   pantoprazole (PROTONIX) 40 MG tablet Take 1 tablet (40 mg total) by mouth daily. (Patient taking differently: Take 40 mg by mouth as needed.) 90 tablet 3   rosuvastatin (CRESTOR) 40 MG tablet TAKE 1 TABLET DAILY 90 tablet 3   No current facility-administered medications on file prior to visit.    Allergies  Allergen Reactions   Sulfa Antibiotics Other (See Comments)    Past Medical History:  Diagnosis Date   Allergy    Alpha thalassemia ([place])    Coronary artery disease    a.  s/p DES to prox LAD 2010. b. s/p CTO PCI of RCA and DES to LAD in  11/2019.   Dyslipidemia    GERD (gastroesophageal reflux disease)    Microcytosis    mild anemia/thrombocytopenia noted on labs with microcytosis   Non Q wave myocardial infarction [facility] 06/2008   stent   Right inguinal hernia 11/17/2017    Past Surgical History:  Procedure Laterality Date   CARDIOVASCULAR STRESS TEST  10/24/2009   EF 60%   CORONARY STENT INTERVENTION N/A 12/17/2019   Procedure: CORONARY STENT INTERVENTION;  Surgeon: Martinique, Zelphia Glover M, MD;  Location: [place] [facility];  Service: Cardiovascular;  Laterality: N/A;   CORONARY STENT PLACEMENT  06/28/2008   SUCCESSFUL INTRACORONARY STENTING OF THE PROXIMAL LEFT ANTERIOR DESCENDING WITH THROMBUS EXTRACTION. EF 65%   fracture arm     INGUINAL HERNIA REPAIR Right 11/17/2017   Procedure: OPEN  RIGHT INGUINAL HERNIA REPAIR;  Surgeon: Fanny Skates, MD;  Location: [place];  Service: General;  Laterality: Right;   INSERTION OF MESH Right 11/17/2017   Procedure: INSERTION OF MESH;  Surgeon: Fanny Skates, MD;  Location: [place];  Service: General;  Laterality: Right;   INTRAVASCULAR ULTRASOUND/IVUS N/A 12/17/2019   Procedure: Intravascular Ultrasound/IVUS;  Surgeon: Martinique, Joandry Slagter M, MD;  Location: [place] [facility];  Service: Cardiovascular;  Laterality: N/A;   KNEE ARTHROSCOPY     LEFT HEART CATH AND CORONARY ANGIOGRAPHY N/A 12/17/2019   Procedure: LEFT HEART CATH AND CORONARY ANGIOGRAPHY;  Surgeon: Martinique, Kitti Mcclish M, MD;  Location: [place] [facility];  Service: Cardiovascular;  Laterality: N/A;    Social History   Tobacco Use  Smoking Status Never  Smokeless Tobacco Never    Social History   Substance and Sexual Activity  Alcohol Use No  He works as an Art gallery manager for [employer].  Family History  Problem Relation Age of Onset   Heart attack Mother    Colon cancer Neg Hx     Review of Systems:  All other systems were reviewed and are negative.  Physical Exam: There were no vitals taken for this visit. GENERAL:  Well appearing male in NAD HEENT:  PERRL, EOMI, sclera are clear. Oropharynx is clear. NECK:  No jugular venous distention, carotid upstroke brisk and symmetric, no bruits, no thyromegaly or adenopathy LUNGS:  Clear to auscultation bilaterally CHEST:  Unremarkable HEART:  RRR,  PMI not displaced or sustained,S1 and S2 within normal limits, no S3, no S4: no clicks, no rubs, no murmurs ABD:  Soft, nontender. BS +, no masses or bruits. No hepatomegaly, no splenomegaly EXT:  2 + pulses throughout, no edema, no cyanosis no clubbing SKIN:  Warm and dry.  No rashes NEURO:  Alert and oriented x 3. Cranial nerves II through XII intact. PSYCH:  Cognitively intact   LABORATORY DATA:  Lab Results  Component Value Date    WBC 9.2 12/18/2019   HGB 11.2 (L) 12/18/2019   HCT 37.2 (L) 12/18/2019   PLT 130 (L) 12/18/2019   GLUCOSE 111 (H) 12/18/2019   CHOL 74 (L) 10/09/2020   TRIG 43 10/09/2020   HDL 49 10/09/2020   LDLCALC 13 10/09/2020   ALT 23 06/18/2018   AST 24 06/18/2018   NA 139 12/18/2019   K 3.7 12/18/2019   CL 105 12/18/2019   CREATININE 0.71 12/18/2019   BUN 8 12/18/2019   CO2 27 12/18/2019   INR 1.0 06/27/2008     Dated 06/09/15: cholesterol 117, triglycerides 77, LDL 71, HDL 31. A1c 4.5%. CMET, CBC, TSH normal Dated 06/14/16: cholesterol 117, triglycerides 65, HDL  41, LDL 63, A1c 4.3%. Hgb 12.3. Chemistries normal. Dated 06/20/17: cholesterol 101, triglycerides 64, HDL 37, LDL 51, plts 131K. T. Bili 2.6. Other chemistries and CBC normal. TSH normal. Dated 06/26/18: cholesterol 124, triglycerides 73, HDL 36, LDL 73. A1c 4.5%. plts 128K. Other CMET, CBC, TSH normal.  Dated 07/14/19: cholesterol 124, triglycerides 55, HDL 44, LDL 69. A1c 4.7%. Normal CMET and CBC.  Dated 07/19/20: A1c 4.8%. cholesterol 118, triglycerides 66, HDL 46, LDL 59. Normal CMET Dated 07/31/21: Cholesterol 81, triglycerides 55, HDL 46, LDL 24. A1c 4.7%,. CMET and TSH normal.  Ecg today shows NSR rate 84. Normal. I have personally reviewed and interpreted this study.  ETT 05/15/16:Study Highlights   There was no ST segment deviation noted during stress.   ETT with excellent exercise tolerance (14:01); no chest pain; normal BP response; no ST changes; negative adequate ETT.    Stress Findings   ECG Baseline ECG exhibits normal sinus rhythm..    Stress Findings The patient exercised following the Bruce protocol.  The patient reported no symptoms during the stress test. The patient experienced no angina during the stress test.   The test was stopped because the patient complained of fatigue.   Blood pressure and heart rate demonstrated a normal response to exercise. Overall, the patient's exercise capacity was excellent.    85% of maximum heart rate was achieved after 8 minutes. Recovery time: 15 minutes.  Duke Treadmill Score: intermediate risk The patient's response to exercise was adequate for diagnosis.    Response to Stress There was no ST segment deviation noted during stress.  Arrhythmias during stress: none.  Arrhythmias during recovery: none.  There were no significant arrhythmias noted during the test.  ECG was interpretable and there was no significant change from baseline.    Stress Measurements   Baseline Vitals  Rest HR 85 bpm    Rest BP 117/74 mmHg    Exercise Time  Exercise duration (min) 14 min    Exercise duration (sec) 1 sec    Peak Stress Vitals  Peak HR 190 bpm    Peak BP 129/67 mmHg    Exercise Data  MPHR 165 bpm    Percent HR 115 %    RPE 16     Estimated workload 17.2 METS       ETT 11/02/19: Study Highlights  Blood pressure demonstrated a normal response to exercise. There was 55mm of horizontal to upsloping ST segment depression in the inferolateral leads at peak exercise which became downsloping in recovery. Positive exercise stress test for ischemia. The patient exercised for 10 minutes reahcing 107% MPHR. Excellent exercise capacity achieving 13.4 mets. Consider further evaluation with coronary CTA or Nuclear stress testing.  Cardiac cath 12/17/19:  CORONARY STENT INTERVENTION  Intravascular Ultrasound/IVUS  LEFT HEART CATH AND CORONARY ANGIOGRAPHY  Conclusion    Prox LAD to Mid LAD lesion is 25% stenosed. Prox LAD lesion is 90% stenosed. Mid LAD lesion is 50% stenosed. A drug-eluting stent was successfully placed using a SYNERGY XD 3.0X32. Post intervention, there is a 0% residual stenosis. 1st Mrg lesion is 40% stenosed. Prox RCA lesion is 50% stenosed. Prox RCA to Mid RCA lesion is 100% stenosed. A drug-eluting stent was successfully placed using a SYNERGY XD 2.50X48. Post intervention, there is a 0% residual stenosis. Mid RCA to Dist RCA  lesion is 100% stenosed. Post intervention, there is a 0% residual stenosis. Post intervention, there is a 0% residual stenosis. Post intervention, there is a 0% residual  stenosis. The left ventricular systolic function is normal. LV end diastolic pressure is normal. The left ventricular ejection fraction is 55-65% by visual estimate.   1. Severe 2 vessel obstructive CAD    -90% proximal LAD at prior stent margin. 50% stenosis distal to stent with severe stenosis by IVUS with 360 degree ring of calcification.    - 100% CTO of the mid RCA.  2. Normal LV function.  3. Normal LVEDP 4. Successful CTO PCI of the mid RCA. This restored flow into a large RV marginal branch. DES x 1 with 2.5 x 48 mm Synergy stent post dilated to 2.75 mm. The terminal RCA remained occluded with collateral flow but this only involves a rather small PL branch. The inferior wall is supplied by a wrap around LAD 5. Successful PCI of the proximal to mid LAD with Shock wave therapy for the severely calcified mid lesion. DES x 1 with 3.0 x 32 mm Onyx post dilated to 3.5 mm   Plan: DAPT for at least one year. I would favor long term DAPT since he has multiple stent layers in the LAD and a very long stent in the RCA. Will monitor overnight. Anticipate DC in am.    ETT 10/25/20: Study Highlights    Patient exercised according to the CVN-BRUCE protocol for 12:58min achieving 13.9 METs The resting HR was 82bpm and rose to a max HR 184bpm which represents 115% of the max, age-predicted HR. ST segment depression was noted during stress in the II, III, aVF, V6 and V5 leads, and returning to baseline after less than 1 minute of recovery. Overall, patient had excellent exercise capacity but there were inferior and anterolateral ST depressions noted during stress that resolved within 1 minute of recovery. Consider further ischemic evaulation with myoview or coronary CTA.   Laurance Flatten, MD  Assessment / Plan: 1. Coronary  disease status post stenting of the proximal LAD in February 2010 with a 3.0 x 15 mm Xience stent. He had residual moderate disease in the LCx and RCA. ETT in Dec. 2017 was normal. He presented with  exertional symptoms c/w angina. ETT showed good exercise tolerance but has decreased from before and he has ST depression that is new. Cardiac cath showed severe stenosis in the proximal and mid LAD and CTO of the mid RCA. He underwent successful DES of the LAD 3.0 x 32 Synergy with Shockwave therapy. The CTO of the RCA was opened and stented with a 2.5 x 48 mm Synergy establishing flow in a large RV branch. A relatively small PL branch was occluded with collaterals. The LAD wraps around the apex and supplies the inferior wall so there was no significant PDA coming off the RCA. He will remain on DAPT indefinitely. Follow up ETT in June 2022 was significantly improved. Symptomatically he is doing very well.   2. Hyperlipidemia on Crestor and PCSK 9 inhibitor.- He is on maximal dose Crestor. Given his premature CAD and very high CV risk I think we need to target a more aggressive lipid goal of LDL. Reports last LDL 25.  Now on Praluent. Planning to switch to Repatha due to insurance coverage.   I will follow up in 6 months.

## 2022-06-12 ENCOUNTER — Encounter: Payer: Self-pay | Admitting: Cardiology

## 2022-06-12 ENCOUNTER — Ambulatory Visit: Payer: 59 | Attending: Cardiology | Admitting: Cardiology

## 2022-06-12 VITALS — BP 128/68 | HR 83 | Ht 66.0 in | Wt 160.6 lb

## 2022-06-12 DIAGNOSIS — I25118 Atherosclerotic heart disease of native coronary artery with other forms of angina pectoris: Secondary | ICD-10-CM

## 2022-06-12 DIAGNOSIS — E785 Hyperlipidemia, unspecified: Secondary | ICD-10-CM | POA: Diagnosis not present

## 2022-06-12 MED ORDER — CLOPIDOGREL BISULFATE 75 MG PO TABS: 75.0000 mg | ORAL_TABLET | Freq: Every day | ORAL | 3 refills | Status: DC

## 2022-06-12 NOTE — Patient Instructions (Addendum)
Medication Instructions:  No Changes In Medications at this time.  *If you need a refill on your cardiac medications before your next appointment, please call your pharmacy*  Lab Work: None Ordered At This Time.  If you have labs (blood work) drawn today and your tests are completely normal, you will receive your results only by: [place] (if you have MyChart) OR A paper copy in the mail If you have any lab test that is abnormal or we need to change your treatment, we will call you to review the results.  Testing/Procedures: Your physician has requested that you have an exercise tolerance test IN 6 MONTHS. For further information please visit [email or web address]. Please also follow instruction sheet, as given.  Your physician has requested that you have an exercise tolerance test.  Please also follow instruction sheet, as given. This will take place at [address], [address] Do not drink or eat foods with caffeine for 24 hours before the test. (Chocolate, coffee, tea, or energy drinks) If you use an inhaler, bring it with you to the test. Do not smoke for 4 hours before the test. Wear comfortable shoes and clothing.  Follow-Up: At [facility], you and your health needs are our priority.  As part of our continuing mission to provide you with exceptional heart care, we have created designated Provider Care Teams.  These Care Teams include your primary Cardiologist (physician) and Advanced Practice Providers (APPs -  Physician Assistants and Nurse Practitioners) who all work together to provide you with the care you need, when you need it.  Your next appointment:   6 month(s) RIGHT AFTER ETT  Provider:   Peter Martinique, MD

## 2022-07-30 ENCOUNTER — Telehealth: Payer: Self-pay | Admitting: Cardiology

## 2022-07-30 ENCOUNTER — Other Ambulatory Visit (HOSPITAL_COMMUNITY): Payer: Self-pay

## 2022-07-30 ENCOUNTER — Telehealth: Payer: Self-pay

## 2022-07-30 NOTE — Telephone Encounter (Signed)
Pt c/o medication issue:  1. Name of Medication:   Evolocumab (REPATHA SURECLICK) [id number] MG/ML SOAJ     2. How are you currently taking this medication (dosage and times per day)?   3. Are you having a reaction (difficulty breathing--STAT)?   4. What is your medication issue?  Marcie Bal states pt needs prior auth for this medication. Pt is out of medication and she states he needs to take it on Friday. She states she also faxed over this info a few minutes ago  Phone number for PA dept: [phone]

## 2022-07-30 NOTE — Telephone Encounter (Signed)
Pharmacy Patient Advocate Encounter  Prior Authorization for REPATHA 140 MG/ML INJ has been approved.    Effective dates: 07/30/22 through 07/30/23   Received notification from [facility] that prior authorization for REPATHA 140 MG/ML INJ is needed.    PA submitted on 07/30/22 Key [id number] Status is pending  Karie Soda, [place] Patient Advocate Specialist Direct Number: [phone] Fax: [phone]

## 2022-09-17 ENCOUNTER — Encounter: Payer: Self-pay | Admitting: Internal Medicine

## 2022-11-14 ENCOUNTER — Other Ambulatory Visit: Payer: Self-pay | Admitting: Cardiology

## 2022-11-14 DIAGNOSIS — E785 Hyperlipidemia, unspecified: Secondary | ICD-10-CM

## 2022-11-14 DIAGNOSIS — I25118 Atherosclerotic heart disease of native coronary artery with other forms of angina pectoris: Secondary | ICD-10-CM

## 2022-11-18 ENCOUNTER — Telehealth: Payer: Self-pay

## 2022-11-18 ENCOUNTER — Ambulatory Visit: Payer: 59 | Admitting: Internal Medicine

## 2022-11-18 ENCOUNTER — Encounter: Payer: Self-pay | Admitting: Internal Medicine

## 2022-11-18 VITALS — BP 106/70 | HR 86 | Ht 66.0 in | Wt 157.0 lb

## 2022-11-18 DIAGNOSIS — Z7902 Long term (current) use of antithrombotics/antiplatelets: Secondary | ICD-10-CM

## 2022-11-18 DIAGNOSIS — Z1211 Encounter for screening for malignant neoplasm of colon: Secondary | ICD-10-CM | POA: Diagnosis not present

## 2022-11-18 DIAGNOSIS — I2510 Atherosclerotic heart disease of native coronary artery without angina pectoris: Secondary | ICD-10-CM | POA: Diagnosis not present

## 2022-11-18 NOTE — Telephone Encounter (Signed)
Dr. Swaziland  We have received a surgical clearance request for EGD for Bob Adams.  He has a PMH of CAD with NSTEMI s/p DES to prox LAD 2010,11/2019 CTo/PCI to [facility] with DES x 1 2.5 x 48 mm,dyslipidemia, GERD, alpha thalassemia. Can you please comment and offer guidance on holding Plavix 5 days prior to his procedure. Please forward you guidance and recommendations to P CV DIV PREOP.  Thank you, Robin Searing, NP

## 2022-11-18 NOTE — Telephone Encounter (Signed)
Request for surgical clearance:     Endoscopy Procedure  What type of surgery is being performed?     colon  When is this surgery scheduled?     TBD  What type of clearance is required ?   Pharmacy  Are there any medications that need to be held prior to surgery and how long? Plavix X 5 days  Practice name and name of physician performing surgery?      [facility]  What is your office phone and fax number?      Phone- [phone]  Fax- [phone]  Anesthesia type (None, local, MAC, general) ?       MAC

## 2022-11-18 NOTE — Progress Notes (Signed)
HISTORY OF PRESENT ILLNESS:  Bob Adams is a 62 y.o. male with coronary artery disease status post coronary artery intervention with drug-eluting stent to the LAD in 2010 and again in July 2021.  He is on chronic DAPT therapy in the form of Plavix and aspirin.  He is sent today primary care provider, Dr. Clelia Croft regarding the need for routine repeat screening colonoscopy.  Patient underwent index screening colonoscopy July 20, 2012.  The examination was normal.  Follow-up in 10 years recommended.  The patient's GI review of systems is entirely negative.  He did see his cardiologist, Dr. Swaziland, June 12, 2022.  Reviewed.  He was doing well at that time.  Last ejection fraction 55 to 65%.  He has no cardiac complaints.  He did undergo Hemoccult testing in Dr. Alver Fisher office 2 years ago.  This was negative.  Most recent comprehensive metabolic panel is unremarkable.  CBC from April 2024 shows a normal hemoglobin of 12.3.  REVIEW OF SYSTEMS:  All non-GI ROS negative entirely  Past Medical History:  Diagnosis Date   Allergy    Alpha thalassemia ([facility])    Anxiety    Coronary artery disease    a.  s/p DES to prox LAD 2010. b. s/p CTO PCI of RCA and DES to LAD in 11/2019.   Dyslipidemia    GERD (gastroesophageal reflux disease)    Microcytosis    mild anemia/thrombocytopenia noted on labs with microcytosis   Non Q wave myocardial infarction [id number]) 06/2008   stent   Right inguinal hernia 11/17/2017    Past Surgical History:  Procedure Laterality Date   CARDIOVASCULAR STRESS TEST  10/24/2009   EF 60%   CORONARY STENT INTERVENTION N/A 12/17/2019   Procedure: CORONARY STENT INTERVENTION;  Surgeon: Swaziland, Peter M, MD;  Location: [facility];  Service: Cardiovascular;  Laterality: N/A;   CORONARY STENT PLACEMENT  06/28/2008   SUCCESSFUL INTRACORONARY STENTING OF THE PROXIMAL LEFT ANTERIOR DESCENDING WITH THROMBUS EXTRACTION. EF 65%   CORONARY ULTRASOUND/IVUS N/A 12/17/2019   Procedure:  Intravascular Ultrasound/IVUS;  Surgeon: Swaziland, Peter M, MD;  Location: [facility] INVASIVE [facility];  Service: Cardiovascular;  Laterality: N/A;   fracture arm     INGUINAL HERNIA REPAIR Right 11/17/2017   Procedure: OPEN RIGHT INGUINAL HERNIA REPAIR;  Surgeon: Claud Kelp, MD;  Location: [facility];  Service: General;  Laterality: Right;   INSERTION OF MESH Right 11/17/2017   Procedure: INSERTION OF MESH;  Surgeon: Claud Kelp, MD;  Location: [facility];  Service: General;  Laterality: Right;   KNEE ARTHROSCOPY     LEFT HEART CATH AND CORONARY ANGIOGRAPHY N/A 12/17/2019   Procedure: LEFT HEART CATH AND CORONARY ANGIOGRAPHY;  Surgeon: Swaziland, Peter M, MD;  Location: [facility] INVASIVE CV LAB;  Service: Cardiovascular;  Laterality: N/A;    Social History TRIG PARADY  reports that he has never smoked. He has never used smokeless tobacco. He reports that he does not drink alcohol and does not use drugs.  family history includes Heart attack in his mother.  Allergies  Allergen Reactions   Sulfa Antibiotics Other (See Comments)       PHYSICAL EXAMINATION: Vital signs: BP 106/70   Pulse 86   Ht 5\' 6"  (1.676 m)   Wt 157 lb (71.2 kg)   BMI 25.34 kg/m   Constitutional: generally well-appearing, no acute distress Psychiatric: alert and oriented x3, cooperative Eyes: extraocular movements intact, anicteric, conjunctiva pink Mouth: oral pharynx moist, no  lesions Neck: supple no lymphadenopathy Cardiovascular: heart regular rate and rhythm, no murmur Lungs: clear to auscultation bilaterally Abdomen: soft, nontender, nondistended, no obvious ascites, no peritoneal signs, normal bowel sounds, no organomegaly Rectal: Deferred to colonoscopy Extremities: no clubbing, cyanosis, or lower extremity edema bilaterally Skin: no lesions on visible extremities Neuro: No focal deficits.  Cranial nerves intact  ASSESSMENT:  1.  Colon cancer screening.  Average risk.   Negative screening colonoscopy 2014 2.  Coronary artery disease with prior coronary stenting, most recent July 2021, on DAPT 3.  Medical problems.  Stable   PLAN:  1.  We discussed colon cancer screening strategies including FIT testing, Cologuard, and optical colonoscopy.  We discussed the pros and cons of each strategy.  We discussed the relevance with regards to his DAPT therapy.  To this end, the patient prefers optical colonoscopy.  He is at higher than average risk due to the need to address his Plavix.The nature of the procedure, as well as the risks, benefits, and alternatives were carefully and thoroughly reviewed with the patient. Ample time for discussion and questions allowed. The patient understood, was satisfied, and agreed to proceed. 2.  Optimally, would like to hold Plavix 5 days prior to his procedure.  I would ask him to continue aspirin throughout.  I will check with his cardiologist, Dr. Swaziland, to see if this is acceptable.  I also discussed with him the feasibility of performing colonoscopy on DAPT therapy with GI associated risks and potential limitations.  He understands.

## 2022-11-18 NOTE — Patient Instructions (Signed)
_______________________________________________________  If your blood pressure at your visit was 140/90 or greater, please contact your primary care physician to follow up on this.  _______________________________________________________  If you are age 63 or older, your body mass index should be between 23-30. Your Body mass index is 25.34 kg/m. If this is out of the aforementioned range listed, please consider follow up with your Primary Care Provider.  If you are age 82 or younger, your body mass index should be between 19-25. Your Body mass index is 25.34 kg/m. If this is out of the aformentioned range listed, please consider follow up with your Primary Care Provider.   ________________________________________________________  The [place] [facility] providers would like to encourage you to use [facility] to communicate with providers for non-urgent requests or questions.  Due to long hold times on the telephone, sending your provider a message by [facility] may be a faster and more efficient way to get a response.  Please allow 48 business hours for a response.  Please remember that this is for non-urgent requests.  _______________________________________________________   It has been recommended to you by your physician that you have a(n) colonoscopy completed. Per your request, we did not schedule the procedure(s) today. Please contact our office at [phone] should you decide to have the procedure completed. You will be scheduled for a pre-visit and procedure at that time.  Thank you for entrusting me with your care and choosing [facility].  Dr Marina Goodell

## 2022-11-19 ENCOUNTER — Ambulatory Visit: Payer: 59 | Attending: Cardiology

## 2022-11-19 DIAGNOSIS — I25118 Atherosclerotic heart disease of native coronary artery with other forms of angina pectoris: Secondary | ICD-10-CM | POA: Diagnosis not present

## 2022-11-19 LAB — EXERCISE TOLERANCE TEST
Angina Index: 0
Duke Treadmill Score: 13
Estimated workload: 14.1
Exercise duration (min): 12 min
Exercise duration (sec): 30 s
MPHR: 158 {beats}/min
Peak HR: 181 {beats}/min
Percent HR: 114 %
RPE: 15
Rest HR: 77 {beats}/min
ST Depression (mm): 0 mm

## 2022-11-19 NOTE — Telephone Encounter (Signed)
   Patient Name: Bob Adams  DOB: 1960-09-20 MRN: [id number]  Primary Cardiologist: [name], MD  Chart reviewed as part of pre-operative protocol coverage. Given past medical history and time since last visit, based on ACC/AHA guidelines, Bob Adams is at acceptable risk for the planned procedure without further cardiovascular testing.   Patient can hold Plavix 5 days prior to procedure and should restart postprocedure when hemostasis is achieved and advised by performing provider.  The patient was advised that if he develops new symptoms prior to surgery to contact our office to arrange for a follow-up visit, and he verbalized understanding.  I will route this recommendation to the requesting party via Epic fax function and remove from pre-op pool.  Please call with questions.  Napoleon Form, Leodis Rains, NP 11/19/2022, 7:49 AM

## 2022-12-10 NOTE — Progress Notes (Signed)
Bob Adams Date of Birth: 1960/07/20   History of Present Illness: Bob Adams is seen today for followup CAD. He is status post stenting of the proximal LAD in 2010 with a drug-eluting stent- 3.0 x 15 mm Xience. He also had 70% RCA disease and 50% LCx disease at that time.  Stress Echo in June 2011 was normal. ETT in Dec. 2017 was normal.   In April 2021 he had atypical cardiac symptoms. An ETT was ordered. While exercise tolerance was still good it was not as good as before and he did develop new ST depression.     He subsequently underwent cardiac cath showing severe 2 vessel CAD with  Progressive LAD disease just proximal to the old stent and there was a calcified lesion distal to the stent in the mid LAD. This was successfully treated with DES following Shock wave therapy. The RCA had a new CTO in the mid vessel. This was successfully reopened and stented with DES. The PL branch remained occluded with good collaterals. In June 2022 he had follow up ETT that showed a significant improvement in exercise tolerance and Ecg changes compared to prior. Most recent ETT on November 19, 2022 was normal with excellent exercise tolerance.   On follow up today he is feeling well. Denies any chest pain or SOB. He is still in the gym working out vigorously 5 days a week. He also does heavy yard work. He is tolerating medication well. Eating a heart healthy diet.  No complaints. Notes his son is enrolling at [employer]    Current Outpatient Medications on File Prior to Visit  Medication Sig Dispense Refill   aspirin 81 MG tablet Take 81 mg by mouth daily.     Cetirizine HCl (ZYRTEC ALLERGY) 10 MG CAPS as needed.     clopidogrel (PLAVIX) 75 MG tablet Take 1 tablet (75 mg total) by mouth daily. 90 tablet 3   Evolocumab (REPATHA SURECLICK) 140 MG/ML SOAJ INNJECT 1 PEN INTO THE SKIN EVERY 14 DAYS 6 mL 1   fluticasone (FLONASE) 50 MCG/ACT nasal spray as needed for allergies.     nitroGLYCERIN  (NITROSTAT) 0.4 MG SL tablet Place 1 tablet (0.4 mg total) under the tongue every 5 (five) minutes as needed for chest pain. 25 tablet 3   rosuvastatin (CRESTOR) 40 MG tablet TAKE 1 TABLET DAILY 90 tablet 3   No current facility-administered medications on file prior to visit.    Allergies  Allergen Reactions   Sulfa Antibiotics Other (See Comments)    Past Medical History:  Diagnosis Date   Allergy    Alpha thalassemia (HCC)    Anxiety    Coronary artery disease    a.  s/p DES to prox LAD 2010. b. s/p CTO PCI of RCA and DES to LAD in 11/2019.   Dyslipidemia    GERD (gastroesophageal reflux disease)    Microcytosis    mild anemia/thrombocytopenia noted on labs with microcytosis   Non Q wave myocardial infarction [facility]) 06/2008   stent   Right inguinal hernia 11/17/2017    Past Surgical History:  Procedure Laterality Date   CARDIOVASCULAR STRESS TEST  10/24/2009   EF 60%   CORONARY STENT INTERVENTION N/A 12/17/2019   Procedure: CORONARY STENT INTERVENTION;  Surgeon: Swaziland, Cheray Pardi M, MD;  Location: [facility] INVASIVE CV LAB;  Service: Cardiovascular;  Laterality: N/A;   CORONARY STENT PLACEMENT  06/28/2008   SUCCESSFUL INTRACORONARY STENTING OF THE PROXIMAL LEFT ANTERIOR DESCENDING WITH THROMBUS  EXTRACTION. EF 65%   CORONARY ULTRASOUND/IVUS N/A 12/17/2019   Procedure: Intravascular Ultrasound/IVUS;  Surgeon: Swaziland, Gesenia Bantz M, MD;  Location: [facility] INVASIVE CV LAB;  Service: Cardiovascular;  Laterality: N/A;   fracture arm     INGUINAL HERNIA REPAIR Right 11/17/2017   Procedure: OPEN RIGHT INGUINAL HERNIA REPAIR;  Surgeon: Claud Kelp, MD;  Location: [facility];  Service: General;  Laterality: Right;   INSERTION OF MESH Right 11/17/2017   Procedure: INSERTION OF MESH;  Surgeon: Claud Kelp, MD;  Location: [facility];  Service: General;  Laterality: Right;   KNEE ARTHROSCOPY     LEFT HEART CATH AND CORONARY ANGIOGRAPHY N/A 12/17/2019   Procedure: LEFT HEART CATH  AND CORONARY ANGIOGRAPHY;  Surgeon: Swaziland, Daziyah Cogan M, MD;  Location: [facility] INVASIVE CV LAB;  Service: Cardiovascular;  Laterality: N/A;    Social History   Tobacco Use  Smoking Status Never  Smokeless Tobacco Never    Social History   Substance and Sexual Activity  Alcohol Use No  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  Family History  Problem Relation Age of Onset   Heart attack Mother    Colon cancer Neg Hx    Stomach cancer Neg Hx    Esophageal cancer Neg Hx     Review of Systems:  All other systems were reviewed and are negative.  Physical Exam: BP 112/64   Pulse 71   Ht 5\' 6"  (1.676 m)   Wt 158 lb 3.2 oz (71.8 kg)   BMI 25.53 kg/m  GENERAL:  Well appearing male in NAD HEENT:  PERRL, EOMI, sclera are clear. Oropharynx is clear. NECK:  No jugular venous distention, carotid upstroke brisk and symmetric, no bruits, no thyromegaly or adenopathy LUNGS:  Clear to auscultation bilaterally CHEST:  Unremarkable HEART:  RRR,  PMI not displaced or sustained,S1 and S2 within normal limits, no S3, no S4: no clicks, no rubs, no murmurs ABD:  Soft, nontender. BS +, no masses or bruits. No hepatomegaly, no splenomegaly EXT:  2 + pulses throughout, no edema, no cyanosis no clubbing SKIN:  Warm and dry.  No rashes NEURO:  Alert and oriented x 3. Cranial nerves II through XII intact. PSYCH:  Cognitively intact   LABORATORY DATA:  Lab Results  Component Value Date   WBC 9.2 12/18/2019   HGB 11.2 (L) 12/18/2019   HCT 37.2 (L) 12/18/2019   PLT 130 (L) 12/18/2019   GLUCOSE 111 (H) 12/18/2019   CHOL 74 (L) 10/09/2020   TRIG 43 10/09/2020   HDL 49 10/09/2020   LDLCALC 13 10/09/2020   ALT 23 06/18/2018   AST 24 06/18/2018   NA 139 12/18/2019   K 3.7 12/18/2019   CL 105 12/18/2019   CREATININE 0.71 12/18/2019   BUN 8 12/18/2019   CO2 27 12/18/2019   INR 1.0 06/27/2008     Dated 06/09/15: cholesterol 117, triglycerides 77, LDL 71, HDL 31. A1c 4.5%. CMET, CBC, TSH  normal Dated 06/14/16: cholesterol 117, triglycerides 65, HDL 41, LDL 63, A1c 4.3%. Hgb 12.3. Chemistries normal. Dated 06/20/17: cholesterol 101, triglycerides 64, HDL 37, LDL 51, plts 131K. T. Bili 2.6. Other chemistries and CBC normal. TSH normal. Dated 06/26/18: cholesterol 124, triglycerides 73, HDL 36, LDL 73. A1c 4.5%. plts 128K. Other CMET, CBC, TSH normal.  Dated 07/14/19: cholesterol 124, triglycerides 55, HDL 44, LDL 69. A1c 4.7%. Normal CMET and CBC.  Dated 07/19/20: A1c 4.8%. cholesterol 118, triglycerides 66, HDL 46, LDL 59. Normal  CMET Dated 07/31/21: Cholesterol 81, triglycerides 55, HDL 46, LDL 24. A1c 4.7%,. CMET and TSH normal. Lipoprotein a 15.  Dated 08/20/22: cholesterol 80, triglycerides 41, HDL 47, LDL 25. A1c 4.7%. CMET and TSH normal.  EKG Interpretation Date/Time:  Thursday December 12 2022 09:41:15 EDT Ventricular Rate:  71 PR Interval:  168 QRS Duration:  80 QT Interval:  386 QTC Calculation: 419 R Axis:   9  Text Interpretation: Normal sinus rhythm Normal ECG When compared with ECG of  No significant change since last tracing31-JUL-2021 06:28  No significant change since  Confirmed by Sw[place]and, Warden Buffa [phone]) on 12/12/2022 9:47:36 AM    ETT 05/15/16:Study Highlights   There was no ST segment deviation noted during stress.   ETT with excellent exercise tolerance (14:01); no chest pain; normal BP response; no ST changes; negative adequate ETT.    Stress Findings   ECG Baseline ECG exhibits normal sinus rhythm..    Stress Findings The patient exercised following the Bruce protocol.  The patient reported no symptoms during the stress test. The patient experienced no angina during the stress test.   The test was stopped because the patient complained of fatigue.   Blood pressure and heart rate demonstrated a normal response to exercise. Overall, the patient's exercise capacity was excellent.   85% of maximum heart rate was achieved after 8 minutes. Recovery time: 15  minutes.  D[facility] Treadmill Score: intermediate risk The patient's response to exercise was adequate for diagnosis.    Response to Stress There was no ST segment deviation noted during stress.  Arrhythmias during stress: none.  Arrhythmias during recovery: none.  There were no significant arrhythmias noted during the test.  ECG was interpretable and there was no significant change from baseline.    Stress Measurements   Baseline Vitals  Rest HR 85 bpm    Rest BP 117/74 mmHg    Exercise Time  Exercise duration (min) 14 min    Exercise duration (sec) 1 sec    Peak Stress Vitals  Peak HR 190 bpm    Peak BP 129/67 mmHg    Exercise Data  MPHR 165 bpm    Percent HR 115 %    RPE 16     Estimated workload 17.2 METS       ETT 11/02/19: Study Highlights  Blood pressure demonstrated a normal response to exercise. There was 2mm of horizontal to upsloping ST segment depression in the inferolateral leads at peak exercise which became downsloping in recovery. Positive exercise stress test for ischemia. The patient exercised for 10 minutes reahcing 107% MPHR. Excellent exercise capacity achieving 13.4 mets. Consider further evaluation with coronary CTA or Nuclear stress testing.  Cardiac cath 12/17/19:  CORONARY STENT INTERVENTION  Intravascular Ultrasound/IVUS  LEFT HEART CATH AND CORONARY ANGIOGRAPHY  Conclusion    Prox LAD to Mid LAD lesion is 25% stenosed. Prox LAD lesion is 90% stenosed. Mid LAD lesion is 50% stenosed. A drug-eluting stent was successfully placed using a SYNERGY XD 3.0X32. Post intervention, there is a 0% residual stenosis. 1st Mrg lesion is 40% stenosed. Prox RCA lesion is 50% stenosed. Prox RCA to Mid RCA lesion is 100% stenosed. A drug-eluting stent was successfully placed using a SYNERGY XD 2.50X48. Post intervention, there is a 0% residual stenosis. Mid RCA to Dist RCA lesion is 100% stenosed. Post intervention, there is a 0% residual  stenosis. Post intervention, there is a 0% residual stenosis. Post intervention, there is a 0% residual stenosis. The left ventricular  systolic function is normal. LV end diastolic pressure is normal. The left ventricular ejection fraction is 55-65% by visual estimate.   1. Severe 2 vessel obstructive CAD    -90% proximal LAD at prior stent margin. 50% stenosis distal to stent with severe stenosis by IVUS with 360 degree ring of calcification.    - 100% CTO of the mid RCA.  2. Normal LV function.  3. Normal LVEDP 4. Successful CTO PCI of the mid RCA. This restored flow into a large RV marginal branch. DES x 1 with 2.5 x 48 mm Synergy stent post dilated to 2.75 mm. The terminal RCA remained occluded with collateral flow but this only involves a rather small PL branch. The inferior wall is supplied by a wrap around LAD 5. Successful PCI of the proximal to mid LAD with Shock wave therapy for the severely calcified mid lesion. DES x 1 with 3.0 x 32 mm Onyx post dilated to 3.5 mm   Plan: DAPT for at least one year. I would favor long term DAPT since he has multiple stent layers in the LAD and a very long stent in the RCA. Will monitor overnight. Anticipate DC in am.    ETT 10/25/20: Study Highlights    Patient exercised according to the CVN-BRUCE protocol for 12:6min achieving 13.9 METs The resting HR was 82bpm and rose to a max HR 184bpm which represents 115% of the max, age-predicted HR. ST segment depression was noted during stress in the II, III, aVF, V6 and V5 leads, and returning to baseline after less than 1 minute of recovery. Overall, patient had excellent exercise capacity but there were inferior and anterolateral ST depressions noted during stress that resolved within 1 minute of recovery. Consider further ischemic evaulation with myoview or coronary CTA.   Bob Flatten, MD  Assessment / Plan: 1. Coronary disease status post stenting of the proximal LAD in February 2010 with a  3.0 x 15 mm Xience stent.  In July 2021 he presented with  exertional symptoms c/w angina. ETT showed good exercise tolerance but has decreased from before and he has ST depression that is new. Cardiac cath showed severe stenosis in the proximal and mid LAD and CTO of the mid RCA. He underwent successful DES of the LAD 3.0 x 32 Synergy with Shockwave therapy. The CTO of the RCA was opened and stented with a 2.5 x 48 mm Synergy establishing flow in a large RV branch. A relatively small PL branch was occluded with collaterals. The LAD wraps around the apex and supplies the inferior wall so there was no significant PDA coming off the RCA. He will remain on DAPT indefinitely. Follow up ETT in July was normal with excellent exercise capacity. Symptomatically he is doing very well.   2. Hyperlipidemia on Crestor and PCSK 9 inhibitor.- on high dose Crestor and Repatha. Last LDL 25. Lipoprotein a was normal at 15.

## 2022-12-11 ENCOUNTER — Encounter (HOSPITAL_COMMUNITY): Payer: 59

## 2022-12-12 ENCOUNTER — Encounter: Payer: Self-pay | Admitting: Cardiology

## 2022-12-12 ENCOUNTER — Ambulatory Visit: Payer: 59 | Attending: Cardiology | Admitting: Cardiology

## 2022-12-12 VITALS — BP 112/64 | HR 71 | Ht 66.0 in | Wt 158.2 lb

## 2022-12-12 DIAGNOSIS — I25118 Atherosclerotic heart disease of native coronary artery with other forms of angina pectoris: Secondary | ICD-10-CM

## 2022-12-12 DIAGNOSIS — E785 Hyperlipidemia, unspecified: Secondary | ICD-10-CM | POA: Diagnosis not present

## 2022-12-12 NOTE — Patient Instructions (Signed)
Medication Instructions:  Your physician recommends that you continue on your current medications as directed. Please refer to the Current Medication list given to you today.  *If you need a refill on your cardiac medications before your next appointment, please call your pharmacy*   Lab Work: No changes  If you have labs (blood work) drawn today and your tests are completely normal, you will receive your results only by: MyChart Message (if you have MyChart) OR A paper copy in the mail If you have any lab test that is abnormal or we need to change your treatment, we will call you to review the results.     Follow-Up: At [facility], you and your health needs are our priority.  As part of our continuing mission to provide you with exceptional heart care, we have created designated Provider Care Teams.  These Care Teams include your primary Cardiologist (physician) and Advanced Practice Providers (APPs -  Physician Assistants and Nurse Practitioners) who all work together to provide you with the care you need, when you need it.      Your next appointment:   6 month(s)  Provider:   [name], MD

## 2023-03-04 ENCOUNTER — Other Ambulatory Visit: Payer: Self-pay | Admitting: Cardiology

## 2023-05-02 ENCOUNTER — Other Ambulatory Visit: Payer: Self-pay | Admitting: Cardiology

## 2023-05-02 DIAGNOSIS — E785 Hyperlipidemia, unspecified: Secondary | ICD-10-CM

## 2023-05-02 DIAGNOSIS — I25118 Atherosclerotic heart disease of native coronary artery with other forms of angina pectoris: Secondary | ICD-10-CM

## 2023-06-02 NOTE — Progress Notes (Signed)
Bob Adams Date of Birth: [birth year]   History of Present Illness: Bob Adams is seen today for followup CAD. He is status post stenting of the proximal LAD in 2010 with a drug-eluting stent- 3.0 x 15 mm Xience. He also had 70% RCA disease and 50% LCx disease at that time.  Stress Echo in June 2011 was normal. ETT in Dec. 2017 was normal.   In April 2021 he had atypical cardiac symptoms. An ETT was ordered. While exercise tolerance was still good it was not as good as before and he did develop new ST depression.     He subsequently underwent cardiac cath showing severe 2 vessel CAD with  Progressive LAD disease just proximal to the old stent and there was a calcified lesion distal to the stent in the mid LAD. This was successfully treated with DES following Shock wave therapy. The RCA had a new CTO in the mid vessel. This was successfully reopened and stented with DES. The PL branch remained occluded with good collaterals. In June 2022 he had follow up ETT that showed a significant improvement in exercise tolerance and Ecg changes compared to prior. Most recent ETT on November 19, 2022 was normal with excellent exercise tolerance.   On follow up today he is feeling well. Denies any chest pain or SOB. He is still in the gym working out vigorously 5 days a week. He is tolerating medication well. Eating a heart healthy diet.  No complaints.     Current Outpatient Medications on File Prior to Visit  Medication Sig Dispense Refill   aspirin 81 MG tablet Take 81 mg by mouth daily.     Cetirizine HCl (ZYRTEC ALLERGY) 10 MG CAPS as needed.     clopidogrel (PLAVIX) 75 MG tablet TAKE 1 TABLET DAILY 90 tablet 1   Evolocumab (REPATHA SURECLICK) 140 MG/ML SOAJ INJECT 1 ML UNDER THE SKIN EVERY 14 DAYS AS DIRECTED 6 mL 3   fluticasone (FLONASE) 50 MCG/ACT nasal spray as needed for allergies.     nitroGLYCERIN (NITROSTAT) 0.4 MG SL tablet Place 1 tablet (0.4 mg total) under the tongue every 5 (five)  minutes as needed for chest pain. 25 tablet 3   rosuvastatin (CRESTOR) 40 MG tablet TAKE 1 TABLET DAILY 90 tablet 3   No current facility-administered medications on file prior to visit.    Allergies  Allergen Reactions   Sulfa Antibiotics Other (See Comments)    Past Medical History:  Diagnosis Date   Allergy    Alpha thalassemia (HCC)    Anxiety    Coronary artery disease    a.  s/p DES to prox LAD 2010. b. s/p CTO PCI of RCA and DES to LAD in 11/2019.   Dyslipidemia    GERD (gastroesophageal reflux disease)    Microcytosis    mild anemia/thrombocytopenia noted on labs with microcytosis   Non Q wave myocardial infarction [facility]) 06/2008   stent   Right inguinal hernia 11/17/2017    Past Surgical History:  Procedure Laterality Date   CARDIOVASCULAR STRESS TEST  10/24/2009   EF 60%   CORONARY STENT INTERVENTION N/A 12/17/2019   Procedure: CORONARY STENT INTERVENTION;  Surgeon: Swaziland, Kenrick Pore M, MD;  Location: [facility] INVASIVE CV LAB;  Service: Cardiovascular;  Laterality: N/A;   CORONARY STENT PLACEMENT  06/28/2008   SUCCESSFUL INTRACORONARY STENTING OF THE PROXIMAL LEFT ANTERIOR DESCENDING WITH THROMBUS EXTRACTION. EF 65%   CORONARY ULTRASOUND/IVUS N/A 12/17/2019   Procedure: Intravascular Ultrasound/IVUS;  Surgeon: [place]azil[place],  Demetria Pore, MD;  Location: [facility] INVASIVE CV LAB;  Service: Cardiovascular;  Laterality: N/A;   fracture arm     INGUINAL HERNIA REPAIR Right 11/17/2017   Procedure: OPEN RIGHT INGUINAL HERNIA REPAIR;  Surgeon: Claud Kelp, MD;  Location: [facility];  Service: General;  Laterality: Right;   INSERTION OF MESH Right 11/17/2017   Procedure: INSERTION OF MESH;  Surgeon: Claud Kelp, MD;  Location: [facility];  Service: General;  Laterality: Right;   KNEE ARTHROSCOPY     LEFT HEART CATH AND CORONARY ANGIOGRAPHY N/A 12/17/2019   Procedure: LEFT HEART CATH AND CORONARY ANGIOGRAPHY;  Surgeon: [place]azil[place], Raelene Trew M, MD;  Location: [facility] INVASIVE CV LAB;   Service: Cardiovascular;  Laterality: N/A;    Social History   Tobacco Use  Smoking Status Never  Smokeless Tobacco Never    Social History   Substance and Sexual Activity  Alcohol Use No  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  Family History  Problem Relation Age of Onset   Heart attack Mother    Colon cancer Neg Hx    Stomach cancer Neg Hx    Esophageal cancer Neg Hx     Review of Systems:  All other systems were reviewed and are negative.  Physical Exam: BP 110/70 (BP Location: Left Arm, Patient Position: Sitting, Cuff Size: Normal)   Pulse 74   Ht 5\' 6"  (1.676 m)   Wt 162 lb 3.2 oz (73.6 kg)   SpO2 97%   BMI 26.18 kg/m  GENERAL:  Well appearing male in NAD HEENT:  PERRL, EOMI, sclera are clear. Oropharynx is clear. NECK:  No jugular venous distention, carotid upstroke brisk and symmetric, no bruits, no thyromegaly or adenopathy LUNGS:  Clear to auscultation bilaterally CHEST:  Unremarkable HEART:  RRR,  PMI not displaced or sustained,S1 and S2 within normal limits, no S3, no S4: no clicks, no rubs, no murmurs ABD:  Soft, nontender. BS +, no masses or bruits. No hepatomegaly, no splenomegaly EXT:  2 + pulses throughout, no edema, no cyanosis no clubbing SKIN:  Warm and dry.  No rashes NEURO:  Alert and oriented x 3. Cranial nerves II through XII intact. PSYCH:  Cognitively intact   LABORATORY DATA:  Lab Results  Component Value Date   WBC 9.2 12/18/2019   HGB 11.2 (L) 12/18/2019   HCT 37.2 (L) 12/18/2019   PLT 130 (L) 12/18/2019   GLUCOSE 111 (H) 12/18/2019   CHOL 74 (L) 10/09/2020   TRIG 43 10/09/2020   HDL 49 10/09/2020   LDLCALC 13 10/09/2020   ALT 23 06/18/2018   AST 24 06/18/2018   NA 139 12/18/2019   K 3.7 12/18/2019   CL 105 12/18/2019   CREATININE 0.71 12/18/2019   BUN 8 12/18/2019   CO2 27 12/18/2019   INR 1.0 06/27/2008     Dated 06/09/15: cholesterol 117, triglycerides 77, LDL 71, HDL 31. A1c 4.5%. CMET, CBC, TSH  normal Dated 06/14/16: cholesterol 117, triglycerides 65, HDL 41, LDL 63, A1c 4.3%. Hgb 12.3. Chemistries normal. Dated 06/20/17: cholesterol 101, triglycerides 64, HDL 37, LDL 51, plts 131K. T. Bili 2.6. Other chemistries and CBC normal. TSH normal. Dated 06/26/18: cholesterol 124, triglycerides 73, HDL 36, LDL 73. A1c 4.5%. plts 128K. Other CMET, CBC, TSH normal.  Dated 07/14/19: cholesterol 124, triglycerides 55, HDL 44, LDL 69. A1c 4.7%. Normal CMET and CBC.  Dated 07/19/20: A1c 4.8%. cholesterol 118, triglycerides 66, HDL 46, LDL 59. Normal CMET Dated 07/31/21:  Cholesterol 81, triglycerides 55, HDL 46, LDL 24. A1c 4.7%,. CMET and TSH normal. Lipoprotein a 15.  Dated 08/20/22: cholesterol 80, triglycerides 41, HDL 47, LDL 25. A1c 4.7%. CMET and TSH normal.       ETT 05/15/16:Study Highlights   There was no ST segment deviation noted during stress.   ETT with excellent exercise tolerance (14:01); no chest pain; normal BP response; no ST changes; negative adequate ETT.    Stress Findings   ECG Baseline ECG exhibits normal sinus rhythm..    Stress Findings The patient exercised following the Bruce protocol.  The patient reported no symptoms during the stress test. The patient experienced no angina during the stress test.   The test was stopped because the patient complained of fatigue.   Blood pressure and heart rate demonstrated a normal response to exercise. Overall, the patient's exercise capacity was excellent.   85% of maximum heart rate was achieved after 8 minutes. Recovery time: 15 minutes.  Duke Treadmill Score: intermediate risk The patient's response to exercise was adequate for diagnosis.    Response to Stress There was no ST segment deviation noted during stress.  Arrhythmias during stress: none.  Arrhythmias during recovery: none.  There were no significant arrhythmias noted during the test.  ECG was interpretable and there was no significant change from baseline.    Stress  Measurements   Baseline Vitals  Rest HR 85 bpm    Rest BP 117/74 mmHg    Exercise Time  Exercise duration (min) 14 min    Exercise duration (sec) 1 sec    Peak Stress Vitals  Peak HR 190 bpm    Peak BP 129/67 mmHg    Exercise Data  MPHR 165 bpm    Percent HR 115 %    RPE 16     Estimated workload 17.2 METS       ETT 11/02/19: Study Highlights  Blood pressure demonstrated a normal response to exercise. There was 2mm of horizontal to upsloping ST segment depression in the inferolateral leads at peak exercise which became downsloping in recovery. Positive exercise stress test for ischemia. The patient exercised for 10 minutes reahcing 107% MPHR. Excellent exercise capacity achieving 13.4 mets. Consider further evaluation with coronary CTA or Nuclear stress testing.  Cardiac cath 12/17/19:  CORONARY STENT INTERVENTION  Intravascular Ultrasound/IVUS  LEFT HEART CATH AND CORONARY ANGIOGRAPHY  Conclusion    Prox LAD to Mid LAD lesion is 25% stenosed. Prox LAD lesion is 90% stenosed. Mid LAD lesion is 50% stenosed. A drug-eluting stent was successfully placed using a SYNERGY XD 3.0X32. Post intervention, there is a 0% residual stenosis. 1st Mrg lesion is 40% stenosed. Prox RCA lesion is 50% stenosed. Prox RCA to Mid RCA lesion is 100% stenosed. A drug-eluting stent was successfully placed using a SYNERGY XD 2.50X48. Post intervention, there is a 0% residual stenosis. Mid RCA to Dist RCA lesion is 100% stenosed. Post intervention, there is a 0% residual stenosis. Post intervention, there is a 0% residual stenosis. Post intervention, there is a 0% residual stenosis. The left ventricular systolic function is normal. LV end diastolic pressure is normal. The left ventricular ejection fraction is 55-65% by visual estimate.   1. Severe 2 vessel obstructive CAD    -90% proximal LAD at prior stent margin. 50% stenosis distal to stent with severe stenosis by IVUS with 360  degree ring of calcification.    - 100% CTO of the mid RCA.  2. Normal LV function.  3.  Normal LVEDP 4. Successful CTO PCI of the mid RCA. This restored flow into a large RV marginal branch. DES x 1 with 2.5 x 48 mm Synergy stent post dilated to 2.75 mm. The terminal RCA remained occluded with collateral flow but this only involves a rather small PL branch. The inferior wall is supplied by a wrap around LAD 5. Successful PCI of the proximal to mid LAD with Shock wave therapy for the severely calcified mid lesion. DES x 1 with 3.0 x 32 mm Onyx post dilated to 3.5 mm   Plan: DAPT for at least one year. I would favor long term DAPT since he has multiple stent layers in the LAD and a very long stent in the RCA. Will monitor overnight. Anticipate DC in am.    ETT 10/25/20: Study Highlights    Patient exercised according to the CVN-BRUCE protocol for 12:59min achieving 13.9 METs The resting HR was 82bpm and rose to a max HR 184bpm which represents 115% of the max, age-predicted HR. ST segment depression was noted during stress in the II, III, aVF, V6 and V5 leads, and returning to baseline after less than 1 minute of recovery. Overall, patient had excellent exercise capacity but there were inferior and anterolateral ST depressions noted during stress that resolved within 1 minute of recovery. Consider further ischemic evaulation with myoview or coronary CTA.   Laurance Flatten, MD  ETT 12/09/22: Study Highlights      Normal ETT   Excellent METS   Stress Findings  Resting ECG ECG is normal. ECG rhythm shows normal sinus rhythm.  Stress Findings A Bruce protocol stress test was performed. Exercise capacity was excellent. Patient exercised for 12 min and 30 sec. Maximum HR of 181 bpm. MPHR 114.0 %. Peak METS 14.1 .  The patient experienced no angina during the test. The patient achieved the target heart rate. The patient reported no symptoms during the stress test. Normal blood pressure and normal  heart rate response noted during stress. Heart rate recovery was normal.  Stress ECG No ST deviation was noted.   Assessment / Plan: 1. Coronary disease status post stenting of the proximal LAD in February 2010 with a 3.0 x 15 mm Xience stent.  In July 2021 he presented with  exertional symptoms c/w angina. ETT showed good exercise tolerance but has decreased from before and he has ST depression that is new. Cardiac cath showed severe stenosis in the proximal and mid LAD and CTO of the mid RCA. He underwent successful DES of the LAD 3.0 x 32 Synergy with Shockwave therapy. The CTO of the RCA was opened and stented with a 2.5 x 48 mm Synergy establishing flow in a large RV branch. A relatively small PL branch was occluded with collaterals. The LAD wraps around the apex and supplies the inferior wall so there was no significant PDA coming off the RCA. He will remain on DAPT indefinitely. Follow up ETT in July was normal with excellent exercise capacity. Symptomatically he is doing very well.   2. Hyperlipidemia on Crestor and PCSK 9 inhibitor.- on high dose Crestor and Repatha. Last LDL 25. Lipoprotein a was normal at 15.  Follow up lab work with Dr Clelia Croft this spring

## 2023-06-08 ENCOUNTER — Other Ambulatory Visit: Payer: Self-pay | Admitting: Cardiology

## 2023-06-13 ENCOUNTER — Ambulatory Visit: Payer: 59 | Attending: Cardiology | Admitting: Cardiology

## 2023-06-13 ENCOUNTER — Encounter: Payer: Self-pay | Admitting: Cardiology

## 2023-06-13 VITALS — BP 110/70 | HR 74 | Ht 66.0 in | Wt 162.2 lb

## 2023-06-13 DIAGNOSIS — E785 Hyperlipidemia, unspecified: Secondary | ICD-10-CM

## 2023-06-13 DIAGNOSIS — I25118 Atherosclerotic heart disease of native coronary artery with other forms of angina pectoris: Secondary | ICD-10-CM

## 2023-06-13 NOTE — Patient Instructions (Signed)
Medication Instructions:  Continue same medications *If you need a refill on your cardiac medications before your next appointment, please call your pharmacy*   Lab Work: None ordered   Testing/Procedures: None ordered   Follow-Up: At [facility], you and your health needs are our priority.  As part of our continuing mission to provide you with exceptional heart care, we have created designated Provider Care Teams.  These Care Teams include your primary Cardiologist (physician) and Advanced Practice Providers (APPs -  Physician Assistants and Nurse Practitioners) who all work together to provide you with the care you need, when you need it.  We recommend signing up for the patient portal called "MyChart".  Sign up information is provided on this After Visit Summary.  MyChart is used to connect with patients for Virtual Visits (Telemedicine).  Patients are able to view lab/test results, encounter notes, upcoming appointments, etc.  Non-urgent messages can be sent to your provider as well.   To learn more about what you can do with MyChart, go to [email or web address].    Your next appointment:  6 months    Call in April to schedule July appointment     Provider:  Dr.Jordan

## 2023-11-28 ENCOUNTER — Other Ambulatory Visit: Payer: Self-pay | Admitting: Cardiology

## 2023-12-10 NOTE — Progress Notes (Unsigned)
 Bob Adams Fate Date of Birth: 03/13/1961   History of Present Illness: Bob Adams is seen today for followup CAD. He is status post stenting of the proximal LAD in 2010 with a drug-eluting stent- 3.0 x 15 mm Xience. He also had 70% RCA disease and 50% LCx disease at that time.  Stress Echo in June 2011 was normal. ETT in Dec. 2017 was normal.   In April 2021 he had atypical cardiac symptoms. An ETT was ordered. While exercise tolerance was still good it was not as good as before and he did develop new ST depression.     He subsequently underwent cardiac cath showing severe 2 vessel CAD with  Progressive LAD disease just proximal to the old stent and there was a calcified lesion distal to the stent in the mid LAD. This was successfully treated with DES following Shock wave therapy. The RCA had a new CTO in the mid vessel. This was successfully reopened and stented with DES. The PL branch remained occluded with good collaterals. In June 2022 he had follow up ETT that showed a significant improvement in exercise tolerance and Ecg changes compared to prior. Most recent ETT on November 19, 2022 was normal with excellent exercise tolerance.   On follow up today he is feeling well. Denies any chest pain or SOB. He is still in the gym working out vigorously 5 days a week. He is tolerating medication well. Eating a heart healthy diet.  No complaints.     Current Outpatient Medications on File Prior to Visit  Medication Sig Dispense Refill   aspirin  81 MG tablet Take 81 mg by mouth daily.     Cetirizine HCl (ZYRTEC ALLERGY) 10 MG CAPS as needed.     clopidogrel  (PLAVIX ) 75 MG tablet TAKE 1 TABLET DAILY 90 tablet 1   Evolocumab  (REPATHA  SURECLICK) 140 MG/ML SOAJ INJECT 1 ML UNDER THE SKIN EVERY 14 DAYS AS DIRECTED 6 mL 3   fluticasone (FLONASE) 50 MCG/ACT nasal spray as needed for allergies.     nitroGLYCERIN  (NITROSTAT ) 0.4 MG SL tablet Place 1 tablet (0.4 mg total) under the tongue every 5 (five)  minutes as needed for chest pain. 25 tablet 3   rosuvastatin  (CRESTOR ) 40 MG tablet TAKE 1 TABLET DAILY 90 tablet 3   No current facility-administered medications on file prior to visit.    Allergies  Allergen Reactions   Sulfa Antibiotics Other (See Comments)    Past Medical History:  Diagnosis Date   Allergy    Alpha thalassemia (HCC)    Anxiety    Coronary artery disease    a.  s/p DES to prox LAD 2010. b. s/p CTO PCI of RCA and DES to LAD in 11/2019.   Dyslipidemia    GERD (gastroesophageal reflux disease)    Microcytosis    mild anemia/thrombocytopenia noted on labs with microcytosis   Non Q wave myocardial infarction [facility]) 06/2008   stent   Right inguinal hernia 11/17/2017    Past Surgical History:  Procedure Laterality Date   CARDIOVASCULAR STRESS TEST  10/24/2009   EF 60%   CORONARY STENT INTERVENTION N/A 12/17/2019   Procedure: CORONARY STENT INTERVENTION;  Surgeon: [place]azil[place], Walter Grima M, MD;  Location: [facility] INVASIVE CV LAB;  Service: Cardiovascular;  Laterality: N/A;   CORONARY STENT PLACEMENT  06/28/2008   SUCCESSFUL INTRACORONARY STENTING OF THE PROXIMAL LEFT ANTERIOR DESCENDING WITH THROMBUS EXTRACTION. EF 65%   CORONARY ULTRASOUND/IVUS N/A 12/17/2019   Procedure: Intravascular Ultrasound/IVUS;  Surgeon: [place]il[place],  Maude HERO, MD;  Location: [facility] INVASIVE CV LAB;  Service: Cardiovascular;  Laterality: N/A;   fracture arm     INGUINAL HERNIA REPAIR Right 11/17/2017   Procedure: OPEN RIGHT INGUINAL HERNIA REPAIR;  Surgeon: Gail Favorite, MD;  Location: [facility];  Service: General;  Laterality: Right;   INSERTION OF MESH Right 11/17/2017   Procedure: INSERTION OF MESH;  Surgeon: Gail Favorite, MD;  Location: [facility];  Service: General;  Laterality: Right;   KNEE ARTHROSCOPY     LEFT HEART CATH AND CORONARY ANGIOGRAPHY N/A 12/17/2019   Procedure: LEFT HEART CATH AND CORONARY ANGIOGRAPHY;  Surgeon: [place], Minnah Llamas M, MD;  Location: [facility] INVASIVE CV LAB;   Service: Cardiovascular;  Laterality: N/A;    Social History   Tobacco Use  Smoking Status Never  Smokeless Tobacco Never    Social History   Substance and Sexual Activity  Alcohol Use No  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  Family History  Problem Relation Age of Onset   Heart attack Mother    Colon cancer Neg Hx    Stomach cancer Neg Hx    Esophageal cancer Neg Hx     Review of Systems:  All other systems were reviewed and are negative.  Physical Exam: There were no vitals taken for this visit. GENERAL:  Well appearing male in NAD HEENT:  PERRL, EOMI, sclera are clear. Oropharynx is clear. NECK:  No jugular venous distention, carotid upstroke brisk and symmetric, no bruits, no thyromegaly or adenopathy LUNGS:  Clear to auscultation bilaterally CHEST:  Unremarkable HEART:  RRR,  PMI not displaced or sustained,S1 and S2 within normal limits, no S3, no S4: no clicks, no rubs, no murmurs ABD:  Soft, nontender. BS +, no masses or bruits. No hepatomegaly, no splenomegaly EXT:  2 + pulses throughout, no edema, no cyanosis no clubbing SKIN:  Warm and dry.  No rashes NEURO:  Alert and oriented x 3. Cranial nerves II through XII intact. PSYCH:  Cognitively intact   LABORATORY DATA:  Lab Results  Component Value Date   WBC 9.2 12/18/2019   HGB 11.2 (L) 12/18/2019   HCT 37.2 (L) 12/18/2019   PLT 130 (L) 12/18/2019   GLUCOSE 111 (H) 12/18/2019   CHOL 74 (L) 10/09/2020   TRIG 43 10/09/2020   HDL 49 10/09/2020   LDLCALC 13 10/09/2020   ALT 23 06/18/2018   AST 24 06/18/2018   NA 139 12/18/2019   K 3.7 12/18/2019   CL 105 12/18/2019   CREATININE 0.71 12/18/2019   BUN 8 12/18/2019   CO2 27 12/18/2019   INR 1.0 06/27/2008     Dated 06/09/15: cholesterol 117, triglycerides 77, LDL 71, HDL 31. A1c 4.5%. CMET, CBC, TSH normal Dated 06/14/16: cholesterol 117, triglycerides 65, HDL 41, LDL 63, A1c 4.3%. Hgb 12.3. Chemistries normal. Dated 06/20/17: cholesterol  101, triglycerides 64, HDL 37, LDL 51, plts 131K. T. Bili 2.6. Other chemistries and CBC normal. TSH normal. Dated 06/26/18: cholesterol 124, triglycerides 73, HDL 36, LDL 73. A1c 4.5%. plts 128K. Other CMET, CBC, TSH normal.  Dated 07/14/19: cholesterol 124, triglycerides 55, HDL 44, LDL 69. A1c 4.7%. Normal CMET and CBC.  Dated 07/19/20: A1c 4.8%. cholesterol 118, triglycerides 66, HDL 46, LDL 59. Normal CMET Dated 07/31/21: Cholesterol 81, triglycerides 55, HDL 46, LDL 24. A1c 4.7%,. CMET and TSH normal. Lipoprotein a 15.  Dated 08/20/22: cholesterol 80, triglycerides 41, HDL 47, LDL 25. A1c 4.7%. CMET and TSH normal.  ETT 05/15/16:Study Highlights   There was no ST segment deviation noted during stress.   ETT with excellent exercise tolerance (14:01); no chest pain; normal BP response; no ST changes; negative adequate ETT.    Stress Findings   ECG Baseline ECG exhibits normal sinus rhythm..    Stress Findings The patient exercised following the Bruce protocol.  The patient reported no symptoms during the stress test. The patient experienced no angina during the stress test.   The test was stopped because the patient complained of fatigue.   Blood pressure and heart rate demonstrated a normal response to exercise. Overall, the patient's exercise capacity was excellent.   85% of maximum heart rate was achieved after 8 minutes. Recovery time: 15 minutes.  Duke Treadmill Score: intermediate risk The patient's response to exercise was adequate for diagnosis.    Response to Stress There was no ST segment deviation noted during stress.  Arrhythmias during stress: none.  Arrhythmias during recovery: none.  There were no significant arrhythmias noted during the test.  ECG was interpretable and there was no significant change from baseline.    Stress Measurements   Baseline Vitals  Rest HR 85 bpm    Rest BP 117/74 mmHg    Exercise Time  Exercise duration (min) 14 min    Exercise  duration (sec) 1 sec    Peak Stress Vitals  Peak HR 190 bpm    Peak BP 129/67 mmHg    Exercise Data  MPHR 165 bpm    Percent HR 115 %    RPE 16     Estimated workload 17.2 METS       ETT 11/02/19: Study Highlights  Blood pressure demonstrated a normal response to exercise. There was 2mm of horizontal to upsloping ST segment depression in the inferolateral leads at peak exercise which became downsloping in recovery. Positive exercise stress test for ischemia. The patient exercised for 10 minutes reahcing 107% MPHR. Excellent exercise capacity achieving 13.4 mets. Consider further evaluation with coronary CTA or Nuclear stress testing.  Cardiac cath 12/17/19:  CORONARY STENT INTERVENTION  Intravascular Ultrasound/IVUS  LEFT HEART CATH AND CORONARY ANGIOGRAPHY  Conclusion    Prox LAD to Mid LAD lesion is 25% stenosed. Prox LAD lesion is 90% stenosed. Mid LAD lesion is 50% stenosed. A drug-eluting stent was successfully placed using a SYNERGY XD 3.0X32. Post intervention, there is a 0% residual stenosis. 1st Mrg lesion is 40% stenosed. Prox RCA lesion is 50% stenosed. Prox RCA to Mid RCA lesion is 100% stenosed. A drug-eluting stent was successfully placed using a SYNERGY XD 2.50X48. Post intervention, there is a 0% residual stenosis. Mid RCA to Dist RCA lesion is 100% stenosed. Post intervention, there is a 0% residual stenosis. Post intervention, there is a 0% residual stenosis. Post intervention, there is a 0% residual stenosis. The left ventricular systolic function is normal. LV end diastolic pressure is normal. The left ventricular ejection fraction is 55-65% by visual estimate.   1. Severe 2 vessel obstructive CAD    -90% proximal LAD at prior stent margin. 50% stenosis distal to stent with severe stenosis by IVUS with 360 degree ring of calcification.    - 100% CTO of the mid RCA.  2. Normal LV function.  3. Normal LVEDP 4. Successful CTO PCI of the mid RCA.  This restored flow into a large RV marginal branch. DES x 1 with 2.5 x 48 mm Synergy stent post dilated to 2.75 mm. The terminal RCA remained occluded with  collateral flow but this only involves a rather small PL branch. The inferior wall is supplied by a wrap around LAD 5. Successful PCI of the proximal to mid LAD with Shock wave therapy for the severely calcified mid lesion. DES x 1 with 3.0 x 32 mm Onyx post dilated to 3.5 mm   Plan: DAPT for at least one year. I would favor long term DAPT since he has multiple stent layers in the LAD and a very long stent in the RCA. Will monitor overnight. Anticipate DC in am.    ETT 10/25/20: Study Highlights    Patient exercised according to the CVN-BRUCE protocol for 12:65min achieving 13.9 METs The resting HR was 82bpm and rose to a max HR 184bpm which represents 115% of the max, age-predicted HR. ST segment depression was noted during stress in the II, III, aVF, V6 and V5 leads, and returning to baseline after less than 1 minute of recovery. Overall, patient had excellent exercise capacity but there were inferior and anterolateral ST depressions noted during stress that resolved within 1 minute of recovery. Consider further ischemic evaulation with myoview  or coronary CTA.   Powell Sorrow, MD  ETT 12/09/22: Study Highlights      Normal ETT   Excellent METS   Stress Findings  Resting ECG ECG is normal. ECG rhythm shows normal sinus rhythm.  Stress Findings A Bruce protocol stress test was performed. Exercise capacity was excellent. Patient exercised for 12 min and 30 sec. Maximum HR of 181 bpm. MPHR 114.0 %. Peak METS 14.1 .  The patient experienced no angina during the test. The patient achieved the target heart rate. The patient reported no symptoms during the stress test. Normal blood pressure and normal heart rate response noted during stress. Heart rate recovery was normal.  Stress ECG No ST deviation was noted.   Assessment / Plan: 1.  Coronary disease status post stenting of the proximal LAD in February 2010 with a 3.0 x 15 mm Xience stent.  In July 2021 he presented with  exertional symptoms c/w angina. ETT showed good exercise tolerance but has decreased from before and he has ST depression that is new. Cardiac cath showed severe stenosis in the proximal and mid LAD and CTO of the mid RCA. He underwent successful DES of the LAD 3.0 x 32 Synergy with Shockwave therapy. The CTO of the RCA was opened and stented with a 2.5 x 48 mm Synergy establishing flow in a large RV branch. A relatively small PL branch was occluded with collaterals. The LAD wraps around the apex and supplies the inferior wall so there was no significant PDA coming off the RCA. He will remain on DAPT indefinitely. Follow up ETT in July was normal with excellent exercise capacity. Symptomatically he is doing very well.   2. Hyperlipidemia on Crestor  and PCSK 9 inhibitor.- on high dose Crestor  and Repatha . Last LDL 25. Lipoprotein a was normal at 15.  Follow up lab work with Dr Loreli this spring

## 2023-12-12 ENCOUNTER — Encounter: Payer: Self-pay | Admitting: Cardiology

## 2023-12-12 ENCOUNTER — Ambulatory Visit: Attending: Cardiology | Admitting: Cardiology

## 2023-12-12 VITALS — BP 102/60 | HR 78 | Ht 66.0 in | Wt 162.4 lb

## 2023-12-12 DIAGNOSIS — I25118 Atherosclerotic heart disease of native coronary artery with other forms of angina pectoris: Secondary | ICD-10-CM

## 2023-12-12 DIAGNOSIS — E785 Hyperlipidemia, unspecified: Secondary | ICD-10-CM

## 2023-12-12 NOTE — Patient Instructions (Signed)

## 2024-01-12 ENCOUNTER — Other Ambulatory Visit: Payer: Self-pay | Admitting: Internal Medicine

## 2024-01-12 DIAGNOSIS — M5416 Radiculopathy, lumbar region: Secondary | ICD-10-CM

## 2024-01-13 ENCOUNTER — Ambulatory Visit
Admission: RE | Admit: 2024-01-13 | Discharge: 2024-01-13 | Disposition: A | Discharge status: Home / Self Care | Source: Ambulatory Visit | Attending: Internal Medicine | Admitting: Internal Medicine

## 2024-01-13 DIAGNOSIS — M5416 Radiculopathy, lumbar region: Secondary | ICD-10-CM

## 2024-02-26 ENCOUNTER — Other Ambulatory Visit: Payer: Self-pay | Admitting: Cardiology

## 2024-04-02 ENCOUNTER — Other Ambulatory Visit: Payer: Self-pay | Admitting: Cardiology

## 2024-04-02 DIAGNOSIS — E785 Hyperlipidemia, unspecified: Secondary | ICD-10-CM

## 2024-04-02 DIAGNOSIS — I25118 Atherosclerotic heart disease of native coronary artery with other forms of angina pectoris: Secondary | ICD-10-CM

## 2024-05-26 ENCOUNTER — Other Ambulatory Visit: Payer: Self-pay | Admitting: Cardiology
